# Patient Record
Sex: Female | Born: 1965 | Race: White | Hispanic: No | Marital: Married | Smoking: Never smoker
Health system: Southern US, Community
[De-identification: ages and names within clinical notes are randomized; demographics above are authoritative.]

## PROBLEM LIST (undated history)

## (undated) DIAGNOSIS — K5900 Constipation, unspecified: Secondary | ICD-10-CM

## (undated) DIAGNOSIS — N83209 Unspecified ovarian cyst, unspecified side: Secondary | ICD-10-CM

## (undated) DIAGNOSIS — T7840XA Allergy, unspecified, initial encounter: Secondary | ICD-10-CM

## (undated) DIAGNOSIS — D229 Melanocytic nevi, unspecified: Secondary | ICD-10-CM

## (undated) DIAGNOSIS — G600 Hereditary motor and sensory neuropathy: Secondary | ICD-10-CM

## (undated) DIAGNOSIS — Z789 Other specified health status: Secondary | ICD-10-CM

## (undated) HISTORY — DX: Allergy, unspecified, initial encounter: T78.40XA

## (undated) HISTORY — DX: Hereditary motor and sensory neuropathy: G60.0

## (undated) HISTORY — DX: Constipation, unspecified: K59.00

## (undated) HISTORY — DX: Unspecified ovarian cyst, unspecified side: N83.209

---
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
## 1996-05-17 HISTORY — PX: WRIST GANGLION EXCISION: SUR520

## 2002-06-15 ENCOUNTER — Ambulatory Visit (HOSPITAL_COMMUNITY): Admission: RE | Admit: 2002-06-15 | Discharge: 2002-06-15 | Payer: Self-pay | Admitting: Obstetrics and Gynecology

## 2002-06-15 ENCOUNTER — Encounter: Payer: Self-pay | Admitting: Obstetrics and Gynecology

## 2002-06-29 ENCOUNTER — Inpatient Hospital Stay (HOSPITAL_COMMUNITY): Admission: AD | Admit: 2002-06-29 | Discharge: 2002-06-29 | Payer: Self-pay | Admitting: Obstetrics and Gynecology

## 2002-07-22 ENCOUNTER — Inpatient Hospital Stay (HOSPITAL_COMMUNITY): Admission: AD | Admit: 2002-07-22 | Discharge: 2002-07-22 | Payer: Self-pay | Admitting: Obstetrics and Gynecology

## 2002-07-23 ENCOUNTER — Inpatient Hospital Stay (HOSPITAL_COMMUNITY): Admission: AD | Admit: 2002-07-23 | Discharge: 2002-07-25 | Payer: Self-pay | Admitting: Obstetrics and Gynecology

## 2003-01-30 ENCOUNTER — Other Ambulatory Visit: Admission: RE | Admit: 2003-01-30 | Discharge: 2003-01-30 | Payer: Self-pay | Admitting: Obstetrics and Gynecology

## 2003-02-13 DIAGNOSIS — N83209 Unspecified ovarian cyst, unspecified side: Secondary | ICD-10-CM

## 2003-02-13 HISTORY — DX: Unspecified ovarian cyst, unspecified side: N83.209

## 2004-08-07 ENCOUNTER — Other Ambulatory Visit: Admission: RE | Admit: 2004-08-07 | Discharge: 2004-08-07 | Payer: Self-pay | Admitting: Obstetrics and Gynecology

## 2004-08-14 ENCOUNTER — Encounter: Admission: RE | Admit: 2004-08-14 | Discharge: 2004-08-14 | Payer: Self-pay | Admitting: Obstetrics and Gynecology

## 2006-02-04 ENCOUNTER — Other Ambulatory Visit: Admission: RE | Admit: 2006-02-04 | Discharge: 2006-02-04 | Payer: Self-pay | Admitting: Obstetrics and Gynecology

## 2006-02-15 ENCOUNTER — Encounter: Admission: RE | Admit: 2006-02-15 | Discharge: 2006-02-15 | Payer: Self-pay | Admitting: Obstetrics and Gynecology

## 2006-11-11 ENCOUNTER — Encounter: Admission: RE | Admit: 2006-11-11 | Discharge: 2006-11-11 | Payer: Self-pay | Admitting: Obstetrics and Gynecology

## 2007-06-29 ENCOUNTER — Encounter: Admission: RE | Admit: 2007-06-29 | Discharge: 2007-06-29 | Payer: Self-pay | Admitting: Obstetrics and Gynecology

## 2008-09-19 ENCOUNTER — Encounter: Admission: RE | Admit: 2008-09-19 | Discharge: 2008-09-19 | Payer: Self-pay | Admitting: Obstetrics and Gynecology

## 2008-09-23 ENCOUNTER — Encounter: Admission: RE | Admit: 2008-09-23 | Discharge: 2008-09-23 | Payer: Self-pay | Admitting: Obstetrics and Gynecology

## 2009-09-09 DIAGNOSIS — R87619 Unspecified abnormal cytological findings in specimens from cervix uteri: Secondary | ICD-10-CM

## 2010-06-08 ENCOUNTER — Encounter: Payer: Self-pay | Admitting: Obstetrics and Gynecology

## 2010-07-28 ENCOUNTER — Ambulatory Visit
Admission: RE | Admit: 2010-07-28 | Discharge: 2010-07-28 | Disposition: A | Discharge status: Home / Self Care | Payer: BC Managed Care – PPO | Source: Ambulatory Visit | Attending: Family Medicine | Admitting: Family Medicine

## 2010-07-28 ENCOUNTER — Other Ambulatory Visit: Payer: Self-pay | Admitting: Family Medicine

## 2010-07-28 DIAGNOSIS — R1031 Right lower quadrant pain: Secondary | ICD-10-CM

## 2010-07-28 MED ORDER — IOHEXOL 300 MG/ML  SOLN
100.0000 mL | Freq: Once | INTRAMUSCULAR | Status: AC | PRN
  Administered 2010-07-28: 100 mL via INTRAVENOUS

## 2010-10-02 NOTE — H&P (Signed)
NAME:  Karla Castro, Karla Castro                        ACCOUNT NO.:  [id number]   MEDICAL RECORD NO.:  [id number]                   PATIENT TYPE:  INP   LOCATION:  9164                                 FACILITY:  [facility]   PHYSICIAN:  Janine Limbo, M.D.            DATE OF BIRTH:  1965/10/20   DATE OF ADMISSION:  07/23/2002  DATE OF DISCHARGE:                                HISTORY & PHYSICAL   HISTORY OF PRESENT ILLNESS:  The patient is a 45 year old married white  female, gravida 2, para 1-0-0-1, at 40-6/7 weeks, who presents complaining  of some vaginal bleeding this morning and some cramping. She reports that  she was evaluated yesterday for possible labor and was at that time 1 cm.  She subsequently has not noted very strong uterine contractions, but has  noted some bloody show.  She reports positive fetal movement. She denies  nausea, vomiting, headaches, or visual disturbances.  Her pregnancy has been  followed at [facility] by the M.D. service and has been essentially  uncomplicated, though, at risk for history of previous infant infected with  GBS, previous infant with congenital heart defect, advanced maternal age  declining amniocentesis, history of rapid labor.  She also has a family  history of some kind of muscular dystrophy.   OB GYN HISTORY:  She is a gravida 2, para 1-0-0-1, who delivered a viable  female infant in December of 2001, who weighed 7 pounds 10 ounces at [redacted] weeks  gestation following a two to three-hour labor.  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   ALLERGIES:  SULFA gives her a rash.   PAST MEDICAL HISTORY:  She reports having had the usual childhood diseases.  She has no other medical problems and her only surgery was a ganglion cyst  removed from her left hand and her only other hospitalization was for  childbirth.   FAMILY HISTORY:  Significant for paternal grandfather deceased from MI.  Maternal grandfather, maternal grandmother,  and uncle with heart disease.  Son has ASD and pulmonary valve stenosis and the problem is correcting  itself.  Maternal grandmother has varicosities.  Maternal grandfather is  deceased from tuberculosis.  Mother has hypothyroidism.  Paternal  grandfather had CVA.   GENETIC HISTORY:  Essentially negative, though, the patient is over age 34  and declined amniocentesis and there is some kind of muscular dystrophy in  her family members.   SOCIAL HISTORY:  She is married to Harriette Ohara, who is involved and  supportive. She is employed full-time as a Futures trader, he is employed full-  time as a Clinical research associate.  They deny any religious affiliation that affects her  care. They deny any illicit drug use, alcohol, or smoking with this  pregnancy.   PRENATAL LABORATORY DATA:  Her blood type is O positive, antibody screen is  negative, syphilis is nonreactive, rubella is positive.  Hepatitis B surface  antigen is negative.  Human immunodeficiency virus is negative.  GC and  Chlamydia are both negative.  Pap smear is within normal limits.  Her one-  hour Glucola was within normal range.  She is going to be treated for Beta  Strep secondary to a history of an infected child.   PHYSICAL EXAMINATION:  VITAL SIGNS:  Stable.  She is afebrile.  HEENT:  Grossly within normal limits.  HEART:  Regular rate and rhythm.  CHEST:  Clear.  BREASTS: Soft and nontender.  ABDOMEN:  Gravid with uterine contractions every five to seven minutes.  Her  fetal heart rate is reactive and reassuring.  PELVIC:  Now 3 to 4 cm, 90%, and vertex at -1 station with intact membranes  and small amount of bloody show.  EXTREMITIES:  Within normal limits.   ASSESSMENT:  1. Intrauterine pregnancy at term.  2. Early labor.  3. Positive Group B Strep with a history of infected infant.   PLAN:  Admit to labor and delivery at [facility] per Janine Limbo, M.D. and to follow routine M.D. orders.     Concha Pyo.  Duplantis, C.N.M.              Janine Limbo, M.D.    SJD/MEDQ  D:  07/23/2002  T:  07/23/2002  Job:  [id number]

## 2011-03-09 ENCOUNTER — Other Ambulatory Visit: Payer: Self-pay

## 2012-02-04 ENCOUNTER — Other Ambulatory Visit: Payer: Self-pay | Admitting: Obstetrics and Gynecology

## 2012-02-04 DIAGNOSIS — Z1231 Encounter for screening mammogram for malignant neoplasm of breast: Secondary | ICD-10-CM

## 2012-02-25 ENCOUNTER — Ambulatory Visit
Admission: RE | Admit: 2012-02-25 | Discharge: 2012-02-25 | Disposition: A | Discharge status: Home / Self Care | Payer: BC Managed Care – PPO | Source: Ambulatory Visit | Attending: Obstetrics and Gynecology | Admitting: Obstetrics and Gynecology

## 2012-02-25 DIAGNOSIS — Z1231 Encounter for screening mammogram for malignant neoplasm of breast: Secondary | ICD-10-CM

## 2012-02-28 ENCOUNTER — Encounter: Payer: Self-pay | Admitting: Obstetrics and Gynecology

## 2012-02-28 ENCOUNTER — Other Ambulatory Visit (HOSPITAL_COMMUNITY)
Admission: RE | Admit: 2012-02-28 | Discharge: 2012-02-28 | Disposition: A | Discharge status: Home / Self Care | Payer: BC Managed Care – PPO | Source: Ambulatory Visit | Attending: Family Medicine | Admitting: Family Medicine

## 2012-02-28 ENCOUNTER — Other Ambulatory Visit: Payer: Self-pay | Admitting: Physician Assistant

## 2012-02-28 DIAGNOSIS — Z0000 Encounter for general adult medical examination without abnormal findings: Secondary | ICD-10-CM | POA: Insufficient documentation

## 2013-01-24 ENCOUNTER — Ambulatory Visit: Payer: BC Managed Care – PPO

## 2013-01-24 ENCOUNTER — Ambulatory Visit: Payer: BC Managed Care – PPO | Admitting: Emergency Medicine

## 2013-01-24 VITALS — BP 116/68 | HR 58 | Temp 98.6°F | Resp 16 | Ht 66.0 in | Wt 138.0 lb

## 2013-01-24 DIAGNOSIS — S7000XA Contusion of unspecified hip, initial encounter: Secondary | ICD-10-CM

## 2013-01-24 DIAGNOSIS — S7001XA Contusion of right hip, initial encounter: Secondary | ICD-10-CM

## 2013-01-24 DIAGNOSIS — S93609A Unspecified sprain of unspecified foot, initial encounter: Secondary | ICD-10-CM

## 2013-01-24 DIAGNOSIS — S93602A Unspecified sprain of left foot, initial encounter: Secondary | ICD-10-CM

## 2013-01-24 DIAGNOSIS — M79672 Pain in left foot: Secondary | ICD-10-CM

## 2013-01-24 NOTE — Patient Instructions (Addendum)
Contusion A contusion is a deep bruise. Contusions are the result of an injury that caused bleeding under the skin. The contusion may turn blue, purple, or yellow. Minor injuries will give you a painless contusion, but more severe contusions may stay painful and swollen for a few weeks.  CAUSES  A contusion is usually caused by a blow, trauma, or direct force to an area of the body. SYMPTOMS   Swelling and redness of the injured area.  Bruising of the injured area.  Tenderness and soreness of the injured area.  Pain. DIAGNOSIS  The diagnosis can be made by taking a history and physical exam. An X-ray, CT scan, or MRI may be needed to determine if there were any associated injuries, such as fractures. TREATMENT  Specific treatment will depend on what area of the body was injured. In general, the best treatment for a contusion is resting, icing, elevating, and applying cold compresses to the injured area. Over-the-counter medicines may also be recommended for pain control. Ask your caregiver what the best treatment is for your contusion. HOME CARE INSTRUCTIONS   Put ice on the injured area.  Put ice in a plastic bag.  Place a towel between your skin and the bag.  Leave the ice on for 15-20 minutes, 3-4 times a day.  Only take over-the-counter or prescription medicines for pain, discomfort, or fever as directed by your caregiver. Your caregiver may recommend avoiding anti-inflammatory medicines (aspirin, ibuprofen, and naproxen) for 48 hours because these medicines may increase bruising.  Rest the injured area.  If possible, elevate the injured area to reduce swelling. SEEK IMMEDIATE MEDICAL CARE IF:   You have increased bruising or swelling.  You have pain that is getting worse.  Your swelling or pain is not relieved with medicines. MAKE SURE YOU:   Understand these instructions.  Will watch your condition.  Will get help right away if you are not doing well or get  worse. Document Released: 02/10/2005 Document Revised: 07/26/2011 Document Reviewed: 03/08/2011 Exit[employer] Patient Information 2014 [employer].  

## 2013-01-24 NOTE — Progress Notes (Signed)
[facility] [address] [place] [address] [phone]  Date:  01/24/2013   Name:  Karla Castro   DOB:  04-Apr-1966   MRN:  [id number]  PCP:  Pcp Not In System    Chief Complaint: Foot Injury and Hip Injury   History of Present Illness:  Karla Castro is a 47 y.o. very pleasant female patient who presents with the following:  Tripped while vacuuming the stairway on Monday morning.  Landed on her right hip and has a significant bruise but injured her LEFT foot and has pain across her midfoot that is worse with walking or standing.  Denies pain in the ankle.  No improvement with over the counter medications or other home remedies. Denies other complaint or health concern today.   There are no active problems to display for this patient.   Past Medical History  Diagnosis Date  . Allergy     Past Surgical History  Procedure Laterality Date  . Wrist ganglion excision  1998    History  Substance Use Topics  . Smoking status: Never Smoker   . Smokeless tobacco: Not on file  . Alcohol Use: Not on file    Family History  Problem Relation Age of Onset  . Arthritis Mother   . Charcot-Marie-Tooth disease Mother   . Osteoporosis Mother   . Hypothyroidism Mother   . Hypertension Father   . Heart attack Maternal Grandmother   . Emphysema Maternal Grandfather   . Vision loss Maternal Grandfather   . Kidney failure Paternal Grandmother   . Hypertension Paternal Grandmother   . Osteoporosis Paternal Grandmother   . Arthritis Paternal Grandmother   . Heart attack Paternal Grandfather     Allergies  Allergen Reactions  . Sulfa Antibiotics Itching and Rash    Medication list has been reviewed and updated.  No current outpatient prescriptions on file prior to visit.   No current facility-administered medications on file prior to visit.    Review of Systems:  As per HPI, otherwise negative.    Physical Examination: Filed Vitals:   01/24/13 1130  BP: 116/68  Pulse: 58  Temp: 98.6 F (37 C)  Resp: 16   Filed Vitals:   01/24/13 1130  Height: 5\' 6"  (1.676 m)  Weight: 138 lb (62.596 kg)   Body mass index is 22.28 kg/(m^2). Ideal Body Weight: Weight in (lb) to have BMI = 25: 154.6   GEN: WDWN, NAD, Non-toxic, Alert & Oriented x 3 HEENT: Atraumatic, Normocephalic.  Ears and Nose: No external deformity. EXTR: No clubbing/cyanosis/edema NEURO: antalgic gait.  PSYCH: Normally interactive. Conversant. Not depressed or anxious appearing.  Calm demeanor.  Large bruise proximal RIGHT hip.  Tender and guards.  LEFT foot:  Tender midfoot.  No ecchymosis or deformity.  Assessment and Plan: Contusion hip Sprain foot OTC motrin Elevate, ice, rest Boot   Signed,  Phillips Odor, MD   [facility] reading (PRIMARY) by  Dr. Dareen Piano.  Foot negative.  [facility] reading (PRIMARY) by  Dr. Dareen Piano.  Hip negative.

## 2013-06-05 ENCOUNTER — Other Ambulatory Visit: Payer: Self-pay

## 2013-06-05 DIAGNOSIS — Z1231 Encounter for screening mammogram for malignant neoplasm of breast: Secondary | ICD-10-CM

## 2013-06-25 ENCOUNTER — Ambulatory Visit
Admission: RE | Admit: 2013-06-25 | Discharge: 2013-06-25 | Disposition: A | Discharge status: Home / Self Care | Payer: BC Managed Care – PPO | Source: Ambulatory Visit

## 2013-06-25 ENCOUNTER — Other Ambulatory Visit: Payer: Self-pay

## 2013-06-25 DIAGNOSIS — Z1231 Encounter for screening mammogram for malignant neoplasm of breast: Secondary | ICD-10-CM

## 2013-08-31 DIAGNOSIS — Z975 Presence of (intrauterine) contraceptive device: Secondary | ICD-10-CM | POA: Insufficient documentation

## 2014-01-15 ENCOUNTER — Ambulatory Visit (INDEPENDENT_AMBULATORY_CARE_PROVIDER_SITE_OTHER): Payer: BC Managed Care – PPO | Admitting: Emergency Medicine

## 2014-01-15 VITALS — BP 120/70 | HR 61 | Temp 98.2°F | Resp 16 | Ht 66.0 in | Wt 140.4 lb

## 2014-01-15 DIAGNOSIS — IMO0002 Reserved for concepts with insufficient information to code with codable children: Secondary | ICD-10-CM

## 2014-01-15 DIAGNOSIS — S50312A Abrasion of left elbow, initial encounter: Secondary | ICD-10-CM

## 2014-01-15 DIAGNOSIS — L02818 Cutaneous abscess of other sites: Secondary | ICD-10-CM

## 2014-01-15 DIAGNOSIS — L03818 Cellulitis of other sites: Secondary | ICD-10-CM

## 2014-01-15 DIAGNOSIS — Z23 Encounter for immunization: Secondary | ICD-10-CM

## 2014-01-15 DIAGNOSIS — S90512A Abrasion, left ankle, initial encounter: Secondary | ICD-10-CM

## 2014-01-15 MED ORDER — DOXYCYCLINE HYCLATE 100 MG PO CAPS: 100.0000 mg | ORAL_CAPSULE | Freq: Two times a day (BID) | ORAL | Status: DC

## 2014-01-15 NOTE — Patient Instructions (Signed)

## 2014-01-15 NOTE — Progress Notes (Signed)
[facility] [address] [place] [address] [phone]  Date:  01/15/2014   Name:  Karla Castro   DOB:  [birth year]   MRN:  [id number]  PCP:  Pcp Not In System    Chief Complaint: Elbow Injury   History of Present Illness:  Karla Castro is a 48 y.o. very pleasant female patient who presents with the following:  Injured this weekend while white water rafting.  Has abrasions on the left elbow and ankle that are increasingly more red and tender. No fever or chills.  Denies any joint injury No improvement with over the counter medications or other home remedies. Denies other complaint or health concern today.   There are no active problems to display for this patient.   Past Medical History  Diagnosis Date  . Allergy     Past Surgical History  Procedure Laterality Date  . Wrist ganglion excision  1998    History  Substance Use Topics  . Smoking status: Never Smoker   . Smokeless tobacco: Never Used  . Alcohol Use: No    Family History  Problem Relation Age of Onset  . Arthritis Mother   . Charcot-Marie-Tooth disease Mother   . Osteoporosis Mother   . Hypothyroidism Mother   . Hypertension Father   . Heart attack Maternal Grandmother   . Emphysema Maternal Grandfather   . Vision loss Maternal Grandfather   . Kidney failure Paternal Grandmother   . Hypertension Paternal Grandmother   . Osteoporosis Paternal Grandmother   . Arthritis Paternal Grandmother   . Heart attack Paternal Grandfather     Allergies  Allergen Reactions  . Sulfa Antibiotics Itching and Rash    Medication list has been reviewed and updated.  No current outpatient prescriptions on file prior to visit.   No current facility-administered medications on file prior to visit.    Review of Systems:  As per HPI, otherwise negative.    Physical Examination: Filed Vitals:   01/15/14 1804  BP: 120/70  Pulse: 61  Temp: 98.2 F (36.8 C)  Resp: 16    Filed Vitals:   01/15/14 1804  Height: 5\' 6"  (1.676 m)  Weight: 140 lb 6 oz (63.674 kg)   Body mass index is 22.67 kg/(m^2). Ideal Body Weight: Weight in (lb) to have BMI = 25: 154.6   GEN: WDWN, NAD, Non-toxic, Alert & Oriented x 3 HEENT: Atraumatic, Normocephalic.  Ears and Nose: No external deformity. EXTR: No clubbing/cyanosis/edema NEURO: Normal gait.  PSYCH: Normally interactive. Conversant. Not depressed or anxious appearing.  Calm demeanor.  SKIN:  Abrasion left elbow and lateral malleolus.  Joint stable. Mild cellulitis   Assessment and Plan: Cellulitis  Abrasions Doxy TDAP  Signed,  Ellison Carwin, MD

## 2015-02-25 ENCOUNTER — Encounter: Payer: Self-pay | Admitting: Family Medicine

## 2015-02-25 ENCOUNTER — Ambulatory Visit (INDEPENDENT_AMBULATORY_CARE_PROVIDER_SITE_OTHER): Payer: BLUE CROSS/BLUE SHIELD | Admitting: Family Medicine

## 2015-02-25 VITALS — BP 95/63 | HR 76 | Temp 98.7°F | Resp 16 | Wt 139.6 lb

## 2015-02-25 DIAGNOSIS — Z23 Encounter for immunization: Secondary | ICD-10-CM

## 2015-02-25 DIAGNOSIS — R1012 Left upper quadrant pain: Secondary | ICD-10-CM | POA: Diagnosis not present

## 2015-02-25 DIAGNOSIS — M25562 Pain in left knee: Secondary | ICD-10-CM

## 2015-02-25 LAB — COMPREHENSIVE METABOLIC PANEL
ALBUMIN: 4.6 g/dL (ref 3.6–5.1)
ALK PHOS: 81 U/L (ref 33–115)
ALT: 13 U/L (ref 6–29)
AST: 15 U/L (ref 10–35)
BUN: 13 mg/dL (ref 7–25)
CALCIUM: 9.5 mg/dL (ref 8.6–10.2)
CHLORIDE: 100 mmol/L (ref 98–110)
CO2: 31 mmol/L (ref 20–31)
Creat: 0.66 mg/dL (ref 0.50–1.10)
Glucose, Bld: 73 mg/dL (ref 65–99)
POTASSIUM: 3.7 mmol/L (ref 3.5–5.3)
Sodium: 136 mmol/L (ref 135–146)
TOTAL PROTEIN: 7.2 g/dL (ref 6.1–8.1)
Total Bilirubin: 0.3 mg/dL (ref 0.2–1.2)

## 2015-02-25 LAB — CBC
HEMATOCRIT: 35.5 % — AB (ref 36.0–46.0)
HEMOGLOBIN: 12.1 g/dL (ref 12.0–15.0)
MCH: 28.7 pg (ref 26.0–34.0)
MCHC: 34.1 g/dL (ref 30.0–36.0)
MCV: 84.1 fL (ref 78.0–100.0)
MPV: 11.5 fL (ref 8.6–12.4)
Platelets: 258 10*3/uL (ref 150–400)
RBC: 4.22 MIL/uL (ref 3.87–5.11)
RDW: 13.5 % (ref 11.5–15.5)
WBC: 5 10*3/uL (ref 4.0–10.5)

## 2015-02-25 LAB — LIPASE: LIPASE: 25 U/L (ref 7–60)

## 2015-02-25 LAB — AMYLASE: AMYLASE: 70 U/L (ref 0–105)

## 2015-02-25 MED ORDER — OMEPRAZOLE 20 MG PO CPDR: 20.0000 mg | DELAYED_RELEASE_CAPSULE | Freq: Every day | ORAL | Status: DC

## 2015-02-25 NOTE — Patient Instructions (Signed)
Start a probiotic daily

## 2015-02-25 NOTE — Progress Notes (Signed)
Subjective:    Patient ID: Karla Castro, female    DOB: 03-12-1966, 49 y.o.   MRN: [id number]  HPI This is a pleasant 49 yo female who presents with 4-6 week history of postprandial LUQ "discomfort." Over the last 3 weeks, she notices more of a "burn," this is worse at night when she lies down and worse with acidic foods and beverages. Pain 6/10 at it's worse. Pain is constant. Drank some kefir yesterday which gave her some relief. Has not taken any OTC medication. Feels like her gut has "slowed down" and has BM every other day which is less frequent for her. Her symptoms were preceded by a gastro illness.  Left knee pain with getting up from standing/ sleeping for about 1 year. Pain goes away with getting up and walking. Does not bother her to garden or walk. Takes ibuprofen 1-2 x per week with good relief. Did have a fall a couple of years ago and hurt left ankle and right shoulder. Has noticed more discomfort with flat shoes. Relief with massage.   Goes to gyn annually. Was seen 8/16- had normal pap and mammo.   Past Medical History  Diagnosis Date  . Allergy    Past Surgical History  Procedure Laterality Date  . Wrist ganglion excision  1998   Family History  Problem Relation Age of Onset  . Arthritis Mother   . Charcot-Marie-Tooth disease Mother   . Osteoporosis Mother   . Hypothyroidism Mother   . Hypertension Father   . Heart attack Maternal Grandmother   . Emphysema Maternal Grandfather   . Vision loss Maternal Grandfather   . Kidney failure Paternal Grandmother   . Hypertension Paternal Grandmother   . Osteoporosis Paternal Grandmother   . Arthritis Paternal Grandmother   . Heart attack Paternal Grandfather    Social History  Substance Use Topics  . Smoking status: Never Smoker   . Smokeless tobacco: Never Used  . Alcohol Use: No   Review of Systems No blood or mucous in stool, no dark stools. Feels bloated and full. No nausea or vomiting. No weight loss. No  urinary symptoms, no vaginal discharge.      Objective:   Physical Exam  Constitutional: She appears well-developed and well-nourished.  HENT:  Head: Normocephalic and atraumatic.  Eyes: Conjunctivae are normal.  Cardiovascular: Normal rate, regular rhythm and normal heart sounds.   Pulmonary/Chest: Effort normal and breath sounds normal.  Abdominal: Soft. Bowel sounds are normal. She exhibits no distension and no mass. Tenderness: mild tenderness LUQ to deep palpation. There is no rebound and no guarding.  Musculoskeletal: Normal range of motion.       Left knee: She exhibits normal range of motion, no swelling, no effusion, no deformity, normal alignment, no LCL laxity and normal patellar mobility. No tenderness found.  Vitals reviewed.  BP 95/63 mmHg  Pulse 76  Temp(Src) 98.7 F (37.1 C) (Oral)  Resp 16  Wt 139 lb 9.6 oz (63.322 kg) Wt Readings from Last 3 Encounters:  02/25/15 139 lb 9.6 oz (63.322 kg)  01/15/14 140 lb 6 oz (63.674 kg)  01/24/13 138 lb (62.596 kg)      Assessment & Plan:  1. Need for prophylactic vaccination and inoculation against influenza - Flu Vaccine QUAD 36+ mos IM  2. Left upper quadrant pain - CBC - Comprehensive metabolic panel - Amylase - Lipase - omeprazole (PRILOSEC) 20 MG capsule; Take 1 capsule (20 mg total) by mouth daily.  Dispense: 30  capsule; Refill: 2 - Recommended OTC probiotic daily - follow up in 1 month, sooner if worsening symptoms  3. Left knee pain - exam unremarkable, discussed quadricept strengthening, wearing supportive shoes, using heat and OTC slip on brace - if no improvement with above measures, can refer to ortho  Clarene Reamer, FNP-BC  [facility] [place] [facility]  02/25/2015 2:32 PM

## 2015-03-24 ENCOUNTER — Ambulatory Visit: Payer: BLUE CROSS/BLUE SHIELD | Admitting: Family Medicine

## 2015-07-29 ENCOUNTER — Encounter: Payer: Self-pay | Admitting: Family Medicine

## 2015-07-29 ENCOUNTER — Ambulatory Visit (INDEPENDENT_AMBULATORY_CARE_PROVIDER_SITE_OTHER): Payer: BLUE CROSS/BLUE SHIELD | Admitting: Family Medicine

## 2015-07-29 VITALS — BP 100/64 | HR 75 | Temp 98.7°F | Resp 16 | Ht 66.0 in | Wt 141.6 lb

## 2015-07-29 DIAGNOSIS — M25512 Pain in left shoulder: Secondary | ICD-10-CM | POA: Diagnosis not present

## 2015-07-29 MED ORDER — MELOXICAM 15 MG PO TABS
15.0000 mg | ORAL_TABLET | Freq: Every day | ORAL | Status: DC
Start: 2015-07-29 — End: 2015-12-27

## 2015-07-29 NOTE — Progress Notes (Signed)
Subjective:    Patient ID: Karla Castro, female    DOB: Nov 20, 1965, 50 y.o.   MRN: [id number]  HPI This is a pleasant 50 year old female that presents with left shoulder pain since last summer. Shoulder is painful with activity. In February pt injured her shoulder after getting her sweater caught on something. Limited range of motion. Pain is not radiating. Pt has tried Ibuprofen, elevating the shoulder, and icing the shoulder with no relief.   Patient has tried some stretches with no relief.   Past Medical History  Diagnosis Date  . Allergy    Family History  Problem Relation Age of Onset  . Arthritis Mother   . Charcot-Marie-Tooth disease Mother   . Osteoporosis Mother   . Hypothyroidism Mother   . Hypertension Father   . Heart attack Maternal Grandmother   . Emphysema Maternal Grandfather   . Vision loss Maternal Grandfather   . Kidney failure Paternal Grandmother   . Hypertension Paternal Grandmother   . Osteoporosis Paternal Grandmother   . Arthritis Paternal Grandmother   . Heart attack Paternal Grandfather    Social History   Social History  . Marital Status: Married    Spouse Name: N/A  . Number of Children: N/A  . Years of Education: N/A   Occupational History  . Not on file.   Social History Main Topics  . Smoking status: Never Smoker   . Smokeless tobacco: Never Used  . Alcohol Use: No  . Drug Use: No  . Sexual Activity: Not on file   Other Topics Concern  . Not on file   Social History Narrative    Review of Systems  Constitutional: Positive for activity change (unable to lift left arm like  before).  HENT: Negative for congestion, mouth sores, rhinorrhea, sinus pressure, sneezing and sore throat.   Eyes: Positive for itching (allergies, son has pink eye also).  Respiratory: Negative for chest tightness, shortness of breath and wheezing.   Cardiovascular: Negative for chest pain.  Gastrointestinal: Negative for nausea, vomiting and  diarrhea.  Musculoskeletal: Positive for myalgias (around left shoulder joint) and arthralgias (left shoulder). Negative for back pain and joint swelling.  Neurological: Negative for dizziness and headaches.       Objective:   Physical Exam  Constitutional: She is oriented to person, place, and time. She appears well-developed and well-nourished.  HENT:  Head: Normocephalic and atraumatic.  Eyes: Conjunctivae are normal. Pupils are equal, round, and reactive to light.  Neck: Normal range of motion.  Cardiovascular: Normal rate, regular rhythm and normal heart sounds.   Pulmonary/Chest: Effort normal and breath sounds normal. No respiratory distress. She has no wheezes.  Musculoskeletal: She exhibits tenderness (left shoulder).  Neurological: She is alert and oriented to person, place, and time.  Skin: Skin is warm and dry.  Psychiatric: She has a normal mood and affect. Her behavior is normal. Judgment and thought content normal.       BP 100/64 mmHg  Pulse 75  Temp(Src) 98.7 F (37.1 C) (Oral)  Resp 16  Ht 5\' 6"  (1.676 m)  Wt 141 lb 9.6 oz (64.229 kg)  BMI 22.87 kg/m2  SpO2 98%  Wt Readings from Last 3 Encounters:  07/29/15 141 lb 9.6 oz (64.229 kg)  02/25/15 139 lb 9.6 oz (63.322 kg)  01/15/14 140 lb 6 oz (63.674 kg)   Temp Readings from Last 3 Encounters:  07/29/15 98.7 F (37.1 C) Oral  02/25/15 98.7 F (37.1 C) Oral  01/15/14 98.2 F (36.8 C) Oral   BP Readings from Last 3 Encounters:  07/29/15 100/64  02/25/15 95/63  01/15/14 120/70   Pulse Readings from Last 3 Encounters:  07/29/15 75  02/25/15 76  01/15/14 61        Assessment & Plan:  1. Left shoulder pain - meloxicam (MOBIC) 15 MG tablet; Take 1 tablet (15 mg total) by mouth daily.  Dispense: 30 tablet; Refill: 1 - Ambulatory referral to Orthopedic Surgery   Steffanie Dunn, FNP-student  [facility] [place] [facility]  07/29/2015 4:03 PM

## 2015-07-29 NOTE — Progress Notes (Signed)
   Subjective:    Patient ID: Karla Castro, female    DOB: 01-29-1966, 50 y.o.   MRN: [id number]  HPI This is a pleasant 50 yo female who presents today with left shoulder pain. 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 She re- injured it while shopping last month; her sweater got caught on a display and pulled her arm back. She has tired ibuprofen, ice/heat. Has had decreased ROM, no numbness, tingling, or weakness. No pain at rest, pain with activity.   Past Medical History  Diagnosis Date  . Allergy    Past Surgical History  Procedure Laterality Date  . Wrist ganglion excision  1998   Family History  Problem Relation Age of Onset  . Arthritis Mother   . Charcot-Marie-Tooth disease Mother   . Osteoporosis Mother   . Hypothyroidism Mother   . Hypertension Father   . Heart attack Maternal Grandmother   . Emphysema Maternal Grandfather   . Vision loss Maternal Grandfather   . Kidney failure Paternal Grandmother   . Hypertension Paternal Grandmother   . Osteoporosis Paternal Grandmother   . Arthritis Paternal Grandmother   . Heart attack Paternal Grandfather    Social History  Substance Use Topics  . Smoking status: Never Smoker   . Smokeless tobacco: Never Used  . Alcohol Use: No      Review of Systems Per HPI    Objective:   Physical Exam  Constitutional: She is oriented to person, place, and time. She appears well-developed and well-nourished. No distress.  HENT:  Head: Normocephalic and atraumatic.  Eyes: Conjunctivae are normal.  Neck: Normal range of motion. Neck supple.  Cardiovascular: Normal rate.   Pulmonary/Chest: Effort normal.  Musculoskeletal:       Left shoulder: She exhibits decreased range of motion, tenderness and pain. She exhibits no bony tenderness, no swelling, no effusion and normal strength.  Pain and decreased ROM with abduction, external rotation.  Neurological: She is alert and oriented to  person, place, and time.  Skin: Skin is warm and dry. She is not diaphoretic.  Psychiatric: She has a normal mood and affect. Her behavior is normal. Judgment and thought content normal.  Vitals reviewed.  BP 100/64 mmHg  Pulse 75  Temp(Src) 98.7 F (37.1 C) (Oral)  Resp 16  Ht 5\' 6"  (1.676 m)  Wt 141 lb 9.6 oz (64.229 kg)  BMI 22.87 kg/m2  SpO2 98%     Assessment & Plan:  1. Left shoulder pain - meloxicam (MOBIC) 15 MG tablet; Take 1 tablet (15 mg total) by mouth daily.  Dispense: 30 tablet; Refill: 1 - Ambulatory referral to [place], FNP-BC  [facility] [place] [facility]  08/02/2015 9:25 AM

## 2015-09-13 DIAGNOSIS — M67912 Unspecified disorder of synovium and tendon, left shoulder: Secondary | ICD-10-CM | POA: Diagnosis not present

## 2015-09-30 DIAGNOSIS — M25512 Pain in left shoulder: Secondary | ICD-10-CM | POA: Diagnosis not present

## 2015-09-30 DIAGNOSIS — M25612 Stiffness of left shoulder, not elsewhere classified: Secondary | ICD-10-CM | POA: Diagnosis not present

## 2015-10-04 DIAGNOSIS — M25512 Pain in left shoulder: Secondary | ICD-10-CM | POA: Diagnosis not present

## 2015-10-04 DIAGNOSIS — M25612 Stiffness of left shoulder, not elsewhere classified: Secondary | ICD-10-CM | POA: Diagnosis not present

## 2015-10-09 DIAGNOSIS — M25512 Pain in left shoulder: Secondary | ICD-10-CM | POA: Diagnosis not present

## 2015-10-09 DIAGNOSIS — M25612 Stiffness of left shoulder, not elsewhere classified: Secondary | ICD-10-CM | POA: Diagnosis not present

## 2015-10-14 DIAGNOSIS — M25612 Stiffness of left shoulder, not elsewhere classified: Secondary | ICD-10-CM | POA: Diagnosis not present

## 2015-10-14 DIAGNOSIS — M25512 Pain in left shoulder: Secondary | ICD-10-CM | POA: Diagnosis not present

## 2015-10-23 DIAGNOSIS — M25512 Pain in left shoulder: Secondary | ICD-10-CM | POA: Diagnosis not present

## 2015-10-23 DIAGNOSIS — M25612 Stiffness of left shoulder, not elsewhere classified: Secondary | ICD-10-CM | POA: Diagnosis not present

## 2015-10-27 DIAGNOSIS — M25612 Stiffness of left shoulder, not elsewhere classified: Secondary | ICD-10-CM | POA: Diagnosis not present

## 2015-10-27 DIAGNOSIS — M25512 Pain in left shoulder: Secondary | ICD-10-CM | POA: Diagnosis not present

## 2015-10-30 DIAGNOSIS — M25612 Stiffness of left shoulder, not elsewhere classified: Secondary | ICD-10-CM | POA: Diagnosis not present

## 2015-10-30 DIAGNOSIS — M25512 Pain in left shoulder: Secondary | ICD-10-CM | POA: Diagnosis not present

## 2015-11-05 DIAGNOSIS — M25512 Pain in left shoulder: Secondary | ICD-10-CM | POA: Diagnosis not present

## 2015-11-05 DIAGNOSIS — H5203 Hypermetropia, bilateral: Secondary | ICD-10-CM | POA: Diagnosis not present

## 2015-11-05 DIAGNOSIS — H524 Presbyopia: Secondary | ICD-10-CM | POA: Diagnosis not present

## 2015-11-05 DIAGNOSIS — H52221 Regular astigmatism, right eye: Secondary | ICD-10-CM | POA: Diagnosis not present

## 2015-11-05 DIAGNOSIS — M25612 Stiffness of left shoulder, not elsewhere classified: Secondary | ICD-10-CM | POA: Diagnosis not present

## 2015-11-06 DIAGNOSIS — M25512 Pain in left shoulder: Secondary | ICD-10-CM | POA: Diagnosis not present

## 2015-11-06 DIAGNOSIS — M25612 Stiffness of left shoulder, not elsewhere classified: Secondary | ICD-10-CM | POA: Diagnosis not present

## 2015-11-11 DIAGNOSIS — M25612 Stiffness of left shoulder, not elsewhere classified: Secondary | ICD-10-CM | POA: Diagnosis not present

## 2015-11-11 DIAGNOSIS — M25512 Pain in left shoulder: Secondary | ICD-10-CM | POA: Diagnosis not present

## 2015-11-13 DIAGNOSIS — M25512 Pain in left shoulder: Secondary | ICD-10-CM | POA: Diagnosis not present

## 2015-11-13 DIAGNOSIS — M25612 Stiffness of left shoulder, not elsewhere classified: Secondary | ICD-10-CM | POA: Diagnosis not present

## 2015-11-24 DIAGNOSIS — M25512 Pain in left shoulder: Secondary | ICD-10-CM | POA: Diagnosis not present

## 2015-11-24 DIAGNOSIS — M25612 Stiffness of left shoulder, not elsewhere classified: Secondary | ICD-10-CM | POA: Diagnosis not present

## 2015-12-27 ENCOUNTER — Encounter: Payer: Self-pay | Admitting: Physician Assistant

## 2015-12-27 ENCOUNTER — Ambulatory Visit (INDEPENDENT_AMBULATORY_CARE_PROVIDER_SITE_OTHER): Payer: BLUE CROSS/BLUE SHIELD | Admitting: Physician Assistant

## 2015-12-27 VITALS — BP 140/68 | HR 76 | Temp 98.1°F | Resp 20 | Ht 66.0 in | Wt 144.4 lb

## 2015-12-27 DIAGNOSIS — R05 Cough: Secondary | ICD-10-CM

## 2015-12-27 DIAGNOSIS — R059 Cough, unspecified: Secondary | ICD-10-CM

## 2015-12-27 MED ORDER — AZITHROMYCIN 250 MG PO TABS: ORAL_TABLET | ORAL | 0 refills | Status: DC

## 2015-12-27 MED ORDER — BENZONATATE 100 MG PO CAPS: 100.0000 mg | ORAL_CAPSULE | Freq: Three times a day (TID) | ORAL | 0 refills | Status: DC | PRN

## 2015-12-27 NOTE — Progress Notes (Signed)
Karla Castro  MRN: [id number] DOB: [birth year]  Subjective:  Karla Castro is a 50 y.o. female seen in office today for a chief complaint of cold x 1 week. Has associated sore throat, cough, body aches, and chills. Denies fever, dizziness, and decreased appetite.  Pt notes she is getting progressively worse. Was initially producing clear sputum during coughing episodes but for the past few days she has thick yellow sputum production.  Of note, patient did help extended family clean out a dusty cabin two weeks ago. One of the family members present had a cough and was recently treated for "walking pneumonia."   Has tried Mucinex, tylenol, and ibuprofen consistently around the clock for the past six days with minimal relief.  Review of Systems  Constitutional: Negative for appetite change.  HENT: Positive for ear pain. Negative for congestion, sinus pressure and sneezing.   Respiratory: Positive for shortness of breath ( during coughing episodes).   Gastrointestinal: Negative for diarrhea, nausea and vomiting.  Allergic/Immunologic: Positive for environmental allergies ( typically in Spring).    There are no active problems to display for this patient.   No current outpatient prescriptions on file prior to visit.   No current facility-administered medications on file prior to visit.     Allergies  Allergen Reactions  . Sulfa Antibiotics Itching and Rash    Objective:  BP 140/68 (BP Location: Right Arm, Patient Position: Sitting, Cuff Size: Normal)   Pulse 76   Temp 98.1 F (36.7 C) (Oral)   Resp 20   Ht 5\' 6"  (1.676 m)   Wt 144 lb 6.4 oz (65.5 kg)   SpO2 98%   BMI 23.31 kg/m   Physical Exam  Constitutional: She is oriented to person, place, and time and well-developed, well-nourished, and in no distress.  HENT:  Head: Normocephalic and atraumatic.  Right Ear: Tympanic membrane, external ear and ear canal normal.  Left Ear: Tympanic membrane, external ear and ear  canal normal.  Nose: Nose normal. Right sinus exhibits no maxillary sinus tenderness and no frontal sinus tenderness. Left sinus exhibits no maxillary sinus tenderness and no frontal sinus tenderness.  Mouth/Throat: Posterior oropharyngeal erythema present.  Eyes: Conjunctivae are normal.  Neck: Normal range of motion.  Cardiovascular: Normal rate, regular rhythm and normal heart sounds.   Pulmonary/Chest: Effort normal. She has no wheezes. She has no rhonchi. She has no rales.  Course breath sounds heard in bilateral anterior and posterior lung fields, cleared with cough.   Lymphadenopathy:       Head (right side): No submental, no submandibular, no tonsillar, no preauricular, no posterior auricular and no occipital adenopathy present.       Head (left side): No submental, no submandibular, no tonsillar, no preauricular, no posterior auricular and no occipital adenopathy present.    She has cervical adenopathy.       Right cervical: Posterior cervical adenopathy present.       Left cervical: Posterior cervical adenopathy present.       Right: No supraclavicular adenopathy present.       Left: No supraclavicular adenopathy present.  Neurological: She is alert and oriented to person, place, and time. Gait normal.  Skin: Skin is warm and dry.  Psychiatric: Affect normal.  Vitals reviewed.   Assessment and Plan :   1. Cough -Due to progressive worsening of cough, change in color of sputum production, and consistent used of OTC supportive meds with no relief, will treat with antibiotics  -  benzonatate (TESSALON) 100 MG capsule; Take 1-2 capsules (100-200 mg total) by mouth 3 (three) times daily as needed for cough.  Dispense: 40 capsule; Refill: 0 - azithromycin (ZITHROMAX) 250 MG tablet; Take 2 tabs PO x 1 dose, then 1 tab PO QD x 4 days  Dispense: 6 tablet; Refill: 0 -Encourage rest and fluids -If no improvement after antibiotic is completed, return to clinic for possible imaging at this  time   Tenna Delaine PA-C  [facility] 12/27/2015 2:39 PM

## 2015-12-27 NOTE — Patient Instructions (Addendum)
Recommend rest and drink plenty of fluids Take antibiotic as prescribed Tessalon perles as needed for cough up to three times a day Return if symptoms do not improve or worsen after antibiotics    IF you received an x-ray today, you will receive an invoice from [facility]. Please contact [facility] at [phone] with questions or concerns regarding your invoice.   IF you received labwork today, you will receive an invoice from [employer]. Please contact [facility] at [phone] with questions or concerns regarding your invoice.   Our billing staff will not be able to assist you with questions regarding bills from these companies.  You will be contacted with the lab results as soon as they are available. The fastest way to get your results is to activate your My Chart account. Instructions are located on the last page of this paperwork. If you have not heard from [place] regarding the results in 2 weeks, please contact this office.

## 2015-12-29 DIAGNOSIS — M25612 Stiffness of left shoulder, not elsewhere classified: Secondary | ICD-10-CM | POA: Diagnosis not present

## 2015-12-29 DIAGNOSIS — M25512 Pain in left shoulder: Secondary | ICD-10-CM | POA: Diagnosis not present

## 2016-01-07 ENCOUNTER — Ambulatory Visit (INDEPENDENT_AMBULATORY_CARE_PROVIDER_SITE_OTHER): Payer: BLUE CROSS/BLUE SHIELD

## 2016-01-07 ENCOUNTER — Ambulatory Visit (INDEPENDENT_AMBULATORY_CARE_PROVIDER_SITE_OTHER): Payer: BLUE CROSS/BLUE SHIELD | Admitting: Physician Assistant

## 2016-01-07 VITALS — BP 110/68 | HR 74 | Temp 98.2°F | Resp 16 | Ht 66.0 in | Wt 142.6 lb

## 2016-01-07 DIAGNOSIS — R300 Dysuria: Secondary | ICD-10-CM | POA: Diagnosis not present

## 2016-01-07 DIAGNOSIS — R059 Cough, unspecified: Secondary | ICD-10-CM

## 2016-01-07 DIAGNOSIS — R05 Cough: Secondary | ICD-10-CM

## 2016-01-07 DIAGNOSIS — J9801 Acute bronchospasm: Secondary | ICD-10-CM | POA: Diagnosis not present

## 2016-01-07 LAB — POCT URINALYSIS DIP (MANUAL ENTRY)
BILIRUBIN UA: NEGATIVE
Blood, UA: NEGATIVE
GLUCOSE UA: NEGATIVE
NITRITE UA: NEGATIVE
PH UA: 5.5
Protein Ur, POC: NEGATIVE
Spec Grav, UA: 1.02
Urobilinogen, UA: 0.2

## 2016-01-07 LAB — POCT CBC
Granulocyte percent: 57 %G (ref 37–80)
HCT, POC: 36.4 % — AB (ref 37.7–47.9)
HEMOGLOBIN: 12.6 g/dL (ref 12.2–16.2)
LYMPH, POC: 2.5 (ref 0.6–3.4)
MCH, POC: 29.4 pg (ref 27–31.2)
MCHC: 34.6 g/dL (ref 31.8–35.4)
MCV: 85 fL (ref 80–97)
MID (cbc): 0.6 (ref 0–0.9)
MPV: 7.7 fL (ref 0–99.8)
POC Granulocyte: 4.1 (ref 2–6.9)
POC LYMPH %: 34.7 % (ref 10–50)
POC MID %: 8.3 % (ref 0–12)
Platelet Count, POC: 270 10*3/uL (ref 142–424)
RBC: 4.28 M/uL (ref 4.04–5.48)
RDW, POC: 12.7 %
WBC: 7.2 10*3/uL (ref 4.6–10.2)

## 2016-01-07 LAB — POC MICROSCOPIC URINALYSIS (UMFC)

## 2016-01-07 MED ORDER — PREDNISONE 20 MG PO TABS: ORAL_TABLET | ORAL | 0 refills | Status: DC

## 2016-01-07 MED ORDER — NITROFURANTOIN MONOHYD MACRO 100 MG PO CAPS: 100.0000 mg | ORAL_CAPSULE | Freq: Two times a day (BID) | ORAL | 0 refills | Status: AC

## 2016-01-07 NOTE — Patient Instructions (Addendum)
Please make sure you are hydrating with 64 oz of water if not more. I would like you to take the prednisone as prescribed.  Please return if your symptoms do not improve.     IF you received an x-ray today, you will receive an invoice from [facility]. Please contact [facility] Radiology at [phone] with questions or concerns regarding your invoice.   IF you received labwork today, you will receive an invoice from [employer]. Please contact [facility] at [phone] with questions or concerns regarding your invoice.   Our billing staff will not be able to assist you with questions regarding bills from these companies.  You will be contacted with the lab results as soon as they are available. The fastest way to get your results is to activate your My Chart account. Instructions are located on the last page of this paperwork. If you have not heard from [place] regarding the results in 2 weeks, please contact this office.

## 2016-01-07 NOTE — Progress Notes (Signed)
[facility] [address], [place] [address] [phone]  By signing my name below I, Karla Castro, attest that this documentation has been prepared under the direction and in the presence of Karla Drape PA. Electonically Signed. Karla Castro, Scribe 01/07/2016 at 3:22 PM  Date:  01/07/2016   Name:  Karla Castro   DOB:  11-17-1965   MRN:  [id number]  PCP:  Pcp Not In System    History of Present Illness: Chief Complaint  Patient presents with   Follow-up    still coughing   Karla Castro is a 50 y.o. female patient who presents to [facility] for follow up of cough. She was seen here 11 days ago for cc of cold like symptoms. Treated with azithromycin. Pt states symptoms started to improve but cough worsened a few days ago. She finished entire course of abx. She reports violent coughing fits with metallic taste in her mouth. Cough is productive consisting of clear/yellow sputum, more during the day. She has hx of allergies but no hx of asthma. Pt reports sick contacts of her nephews who were diagnosed with walking pneumonia. At that time, 1 month ago,  she had been cleaning out the house she had to stay in, which was was filled with mouse feces. She denies SOB. Pt is not a smoker.   Pt also complains of of very slight dysuria and pressure. She suspects this may be UTI.    There are no active problems to display for this patient.   Past Medical History:  Diagnosis Date   Allergy     Past Surgical History:  Procedure Laterality Date   WRIST GANGLION EXCISION  1998    Social History  Substance Use Topics   Smoking status: Never Smoker   Smokeless tobacco: Never Used   Alcohol use No    Family History  Problem Relation Age of Onset   Arthritis Mother    Charcot-Marie-Tooth disease Mother    Osteoporosis Mother    Hypothyroidism Mother    Hypertension Father    Heart attack Maternal Grandmother    Emphysema Maternal Grandfather     Vision loss Maternal Grandfather    Kidney failure Paternal Grandmother    Hypertension Paternal Grandmother    Osteoporosis Paternal Grandmother    Arthritis Paternal Grandmother    Heart attack Paternal Grandfather     Allergies  Allergen Reactions   Sulfa Antibiotics Itching and Rash    Medication list has been reviewed and updated.  Current Outpatient Prescriptions on File Prior to Visit  Medication Sig Dispense Refill   GuaiFENesin (MUCINEX PO) Take by mouth.     No current facility-administered medications on file prior to visit.     Review of Systems  Constitutional: Positive for malaise/fatigue. Negative for chills and fever.  Respiratory: Positive for cough and sputum production. Negative for shortness of breath.   Gastrointestinal: Positive for abdominal pain. Negative for nausea and vomiting.  Genitourinary: Positive for dysuria.   ROS unremarkable unless otherwise specified.  Physical Examination: BP 110/68 (BP Location: Right Arm, Patient Position: Sitting, Cuff Size: Normal)    Pulse 74    Temp 98.2 F (36.8 C) (Oral)    Resp 16    Ht 5\' 6"  (1.676 m)    Wt 142 lb 9.6 oz (64.7 kg)    SpO2 98%    BMI 23.02 kg/m  Ideal Body Weight: @FLOWAMB [id number]  Physical Exam  Constitutional: She is oriented to person,  place, and time. She appears well-developed and well-nourished. No distress.  HENT:  Head: Normocephalic and atraumatic.  Right Ear: External ear normal.  Left Ear: External ear normal.  Nose: Rhinorrhea present. No mucosal edema.  Mouth/Throat: Oropharynx is clear and moist.  Eyes: Conjunctivae and EOM are normal. Pupils are equal, round, and reactive to light.  Neck: No thyromegaly present.  Cardiovascular: Normal rate.   Pulmonary/Chest: Effort normal. No respiratory distress.  Abdominal: There is tenderness in the suprapubic area. There is no CVA tenderness.  Lymphadenopathy:    She has no cervical adenopathy.  Neurological: She is alert  and oriented to person, place, and time.  Skin: She is not diaphoretic.  Psychiatric: She has a normal mood and affect. Her behavior is normal.   Dg Chest 2 View  Result Date: 01/07/2016 CLINICAL DATA:  Productive cough, fatigue, recent antibiotic treatment EXAM: CHEST  2 VIEW COMPARISON:  Chest x-ray of 08/20/2009 FINDINGS: No active infiltrate or effusion is seen. Mediastinal and hilar contours are unremarkable. The heart is within normal limits in size. No bony abnormality is seen. IMPRESSION: No active cardiopulmonary disease. Electronically Signed   By: Karla Castro M.D.   On: 01/07/2016 15:47    Assessment and Plan: Karla Castro is a 50 y.o. female who is here today for cough. Will treat with prednisone at this time.  Advised to return in 2 days, if her symptoms do not improve.  Cough - Plan: POCT urinalysis dipstick, POCT Microscopic Urinalysis ([facility]), DG Chest 2 View, POCT CBC  Dysuria - Plan: nitrofurantoin, macrocrystal-monohydrate, (MACROBID) 100 MG capsule  Bronchospasm - Plan: DG Chest 2 View, Urine culture, predniSONE (DELTASONE) 20 MG tablet  Karla Drape, PA-C [facility] 01/07/2016 3:22 PM

## 2016-01-09 LAB — URINE CULTURE: Organism ID, Bacteria: 10000

## 2016-01-13 DIAGNOSIS — Z1231 Encounter for screening mammogram for malignant neoplasm of breast: Secondary | ICD-10-CM | POA: Diagnosis not present

## 2016-01-13 DIAGNOSIS — Z01419 Encounter for gynecological examination (general) (routine) without abnormal findings: Secondary | ICD-10-CM | POA: Diagnosis not present

## 2016-01-23 ENCOUNTER — Encounter: Payer: Self-pay | Admitting: Physician Assistant

## 2016-01-23 DIAGNOSIS — N959 Unspecified menopausal and perimenopausal disorder: Secondary | ICD-10-CM | POA: Insufficient documentation

## 2016-05-20 ENCOUNTER — Other Ambulatory Visit: Payer: Self-pay | Admitting: Family Medicine

## 2016-06-11 ENCOUNTER — Ambulatory Visit (INDEPENDENT_AMBULATORY_CARE_PROVIDER_SITE_OTHER): Payer: BLUE CROSS/BLUE SHIELD | Admitting: Physician Assistant

## 2016-06-11 ENCOUNTER — Ambulatory Visit (INDEPENDENT_AMBULATORY_CARE_PROVIDER_SITE_OTHER): Payer: BLUE CROSS/BLUE SHIELD

## 2016-06-11 VITALS — BP 102/64 | HR 76 | Temp 98.3°F | Resp 14 | Ht 66.0 in | Wt 140.0 lb

## 2016-06-11 DIAGNOSIS — R1013 Epigastric pain: Secondary | ICD-10-CM

## 2016-06-11 DIAGNOSIS — Z23 Encounter for immunization: Secondary | ICD-10-CM

## 2016-06-11 DIAGNOSIS — R140 Abdominal distension (gaseous): Secondary | ICD-10-CM | POA: Diagnosis not present

## 2016-06-11 DIAGNOSIS — R109 Unspecified abdominal pain: Secondary | ICD-10-CM | POA: Diagnosis not present

## 2016-06-11 DIAGNOSIS — R112 Nausea with vomiting, unspecified: Secondary | ICD-10-CM | POA: Diagnosis not present

## 2016-06-11 MED ORDER — SUCRALFATE 1 G PO TABS: 1.0000 g | ORAL_TABLET | Freq: Three times a day (TID) | ORAL | 1 refills | Status: DC

## 2016-06-11 NOTE — Progress Notes (Signed)
Patient ID: Karla Castro, female    DOB: 1965-06-11, 51 y.o.   MRN: [id number]  PCP: Pcp Not In System  Chief Complaint  Patient presents with  . Abdominal Pain    w/nausea  . Flu Vaccine    Subjective:   Presents for evaluation of constant burning abdominal pan for about 18 months, with episodic worsening associated with nausea, dry heaves.  The pain is worse with orange juice and carbonated beverages, but no other identified triggers. Loss of appetite is really worry that eating will exacerbate her symptoms. Feels "blocked up and bloated," and has the sensation that belching and passing gas would help to ease her symptoms. OTC antacid, omeprazole and probiotics have been ineffective.  No weight loss, fever, chills, urinary symptoms. No personal or family history of GI or GU cancers. Family history of IBS.  From visit with Ms. Karla Castro 02/25/2015: This is a pleasant 51 yo female who presents with 4-6 week history of postprandial LUQ "discomfort." Over the last 3 weeks, she notices more of a "burn," this is worse at night when she lies down and worse with acidic foods and beverages. Pain 6/10 at it's worse. Pain is constant. Drank some kefir yesterday which gave her some relief. Has not taken any OTC medication. Feels like her gut has "slowed down" and has BM every other day which is less frequent for her. Her symptoms were preceded by a gastro illness. She was prescribed omeprazole, and advised to RTC in 1 month for re-evaluation, which she did not do. CBC, CMET, Amylase and Lipase were normal. Two visits 12/2015 for cough are also reviewed, in which this issue was not mentioned.   Review of Systems  Constitutional: Positive for appetite change (afraid to eat).  Respiratory: Negative for cough, shortness of breath and wheezing.   Cardiovascular: Negative for chest pain, palpitations and leg swelling.  Gastrointestinal: Positive for abdominal pain, constipation, nausea and  vomiting. Abdominal distention: bloating.  Endocrine: Negative for cold intolerance, heat intolerance, polydipsia, polyphagia and polyuria.  Genitourinary: Negative for dysuria, frequency, hematuria, menstrual problem and urgency.       Patient Active Problem List   Diagnosis Date Noted  . Premenopausal patient 01/23/2016     Prior to Admission medications   Medication Sig Start Date End Date Taking? Authorizing Provider  fluticasone (FLONASE) 50 MCG/ACT nasal spray Place into both nostrils daily.   Yes Historical Provider, MD  Melatonin 2.5 MG CAPS Take by mouth.   Yes Historical Provider, MD  omeprazole (PRILOSEC) 20 MG capsule Take 20 mg by mouth daily.   Yes Historical Provider, MD     Allergies  Allergen Reactions  . Sulfa Antibiotics Itching and Rash       Objective:  Physical Exam  Constitutional: She is oriented to person, place, and time. She appears well-developed and well-nourished. She is active and cooperative. No distress.  BP 102/64   Pulse 76   Temp 98.3 F (36.8 C) (Oral)   Resp 14   Ht 5\' 6"  (1.676 m)   Wt 140 lb (63.5 kg)   SpO2 98%   BMI 22.60 kg/m   HENT:  Head: Normocephalic and atraumatic.  Right Ear: Hearing normal.  Left Ear: Hearing normal.  Eyes: Conjunctivae are normal. No scleral icterus.  Neck: Normal range of motion. Neck supple. No thyromegaly present.  Cardiovascular: Normal rate, regular rhythm and normal heart sounds.   Pulses:      Radial pulses are 2+ on  the right side, and 2+ on the left side.  Pulmonary/Chest: Effort normal and breath sounds normal.  Abdominal: Soft. Normal appearance and bowel sounds are normal. She exhibits no distension and no mass. There is no hepatosplenomegaly. There is tenderness in the epigastric area. There is no rigidity, no rebound, no guarding, no tenderness at McBurney's point and negative Murphy's sign.  Lymphadenopathy:       Head (right side): No tonsillar, no preauricular, no posterior  auricular and no occipital adenopathy present.       Head (left side): No tonsillar, no preauricular, no posterior auricular and no occipital adenopathy present.    She has no cervical adenopathy.       Right: No supraclavicular adenopathy present.       Left: No supraclavicular adenopathy present.  Neurological: She is alert and oriented to person, place, and time. No sensory deficit.  Skin: Skin is warm, dry and intact. No rash noted. No cyanosis or erythema. Nails show no clubbing.  Psychiatric: She has a normal mood and affect. Her speech is normal and behavior is normal.    Dg Abd Acute W/chest  Result Date: 06/11/2016 CLINICAL DATA:  51 year old female with history of epigastric abdominal pain, bloating and feeling of constipation. EXAM: DG ABDOMEN ACUTE W/ 1V CHEST COMPARISON:  Chest x-ray 01/07/2016. FINDINGS: Lung volumes are normal. No consolidative airspace disease. No pleural effusions. No pneumothorax. No pulmonary nodule or mass noted. Pulmonary vasculature and the cardiomediastinal silhouette are within normal limits. Gas and stool are seen scattered throughout the colon extending to the level of the distal rectum. No pathologic distension of small bowel is noted. Relatively large volume of well-formed stool throughout the colon. IUD projecting over the low anatomic pelvis. No gross evidence of pneumoperitoneum. IMPRESSION: 1.  Nonobstructive bowel gas pattern. 2. No pneumoperitoneum. 3. Relatively large volume of well-formed stool throughout the colon may suggest mild constipation. 4. No radiographic evidence of acute cardiopulmonary disease. Electronically Signed   By: Vinnie Langton M.D.   On: 06/11/2016 10:36          Assessment & Plan:   1. Abdominal pain, epigastric 2. Nausea and vomiting, intractability of vomiting not specified, unspecified vomiting type 3. Abdominal bloating Reassuring radiographs. Trial of carafate and counseled on constipation remedies. Await CMET  and H pylori. If tests are normal and symptoms persist, plan RUQ [place] and possible GI referral. - H. pylori breath test - Comprehensive metabolic panel - DG Abd Acute W/Chest; Future - sucralfate (CARAFATE) 1 g tablet; Take 1 tablet (1 g total) by mouth 4 (four) times daily -  with meals and at bedtime.  Dispense: 30 tablet; Refill: 1  4. Needs flu shot - Flu Vaccine QUAD 36+ mos IM   Fara Chute, PA-C [facility]

## 2016-06-11 NOTE — Progress Notes (Signed)
     Patient ID: Karla Castro, female    DOB: 1966-04-16, 51 y.o.   MRN: [id number]  PCP: Pcp Not In System  Chief Complaint  Patient presents with  . Abdominal Pain    w/nausea  . Flu Vaccine    Subjective:   Presents for evaluation of epigastric abdominal pain.  Pt is a 51yo caucasian female who presents with epigastric abdominal pain. The pain has been present for about 1.5 years, came on gradually, and has worsened in the past few months. Pts states that the pain is located in the epigastrium and does not radiate to the back or to other areas of the abdomen. Pt states that she is constantly uncomfortable, but has "flare ups" of pain, nausea, anorexia, and dry heaving approximately every 1-1.5 months. Flare ups sometimes occur with food, sometimes not. The only identified triggers have been carbonated drinks and orange juice. Pt staes that she feels "bloated" and "blocked up" and like she needs to pass gas and/or burp. Has tried Tums, Omeprazole, and Probiotics with no relief. Family history is pertent for only IBS. She admits to occasional constipation. Denies fever, chills, fatigue, weight loss, skin changes, vomiting, hematochezia, or dysuria.    Review of Systems See HPI.    Patient Active Problem List   Diagnosis Date Noted  . Premenopausal patient 01/23/2016     Prior to Admission medications   Medication Sig Start Date End Date Taking? Authorizing Provider  fluticasone (FLONASE) 50 MCG/ACT nasal spray Place into both nostrils daily.   Yes Historical Provider, MD  omeprazole (PRILOSEC) 20 MG capsule Take 20 mg by mouth daily.   Yes Historical Provider, MD     Allergies  Allergen Reactions  . Sulfa Antibiotics Itching and Rash       Objective:  Physical Exam HEENT: PERRLA. No scleral icterus. Throat is nonerythematous, no exudates. Pulm: Good respiratory effort. CTAB. No wheezes, rales, or rhonchi. CV: RRR. No M/R/G. Abd: Discomfort to palpation of the mid  epigastrum. Abdomen soft, nondistended. + BS x 4 quadrants. Negative Murphy's sign. No hepatosplenomegaly.  Skin: No rashes or jaundice.    Assessment & Plan:   1. Abdominal pain, epigastric Xray reveals relatively large volume of well-formed stool thought the colon. Pt advised to continue Omeprazole and add Carafate pending H. Pylori Breath Test and CMP results. Pt advised to drinking plenty of water, consume fibrous foods, and try OTC Miralax for relief of constipation and bloating. - Comprehensive metabolic panel - DG Abd Acute W/Chest; Future - sucralfate (CARAFATE) 1 g tablet; Take 1 tablet (1 g total) by mouth 4 (four) times daily -  with meals and at bedtime.  Dispense: 30 tablet; Refill: 1  2. Nausea and vomiting, intractability of vomiting not specified, unspecified vomiting type - H. pylori breath test - sucralfate (CARAFATE) 1 g tablet; Take 1 tablet (1 g total) by mouth 4 (four) times daily -  with meals and at bedtime.  Dispense: 30 tablet; Refill: 1  3. Abdominal bloating  4. Needs flu shot - Flu Vaccine QUAD 36+ mos IM   Lorella Nimrod, PA-S

## 2016-06-11 NOTE — Patient Instructions (Addendum)
To help reduce constipation and promote bowel health, 1. Drink at least 64 ounces of water each day; 2. Eat plenty of fiber (fruits, vegetables, whole grains, legumes) 3. Get plenty of physical activity If needed, use a stool softener (docusate) or an osmotic laxative (like Miralax) each day, or as needed.       IF you received an x-ray today, you will receive an invoice from [facility] Radiology. Please contact [facility]iology at [phone] with questions or concerns regarding your invoice.   IF you received labwork today, you will receive an invoice from [facility]. Please contact [facility] at [phone] with questions or concerns regarding your invoice.   Our billing staff will not be able to assist you with questions regarding bills from these companies.  You will be contacted with the lab results as soon as they are available. The fastest way to get your results is to activate your My Chart account. Instructions are located on the last page of this paperwork. If you have not heard from [place] regarding the results in 2 weeks, please contact this office.

## 2016-06-12 LAB — COMPREHENSIVE METABOLIC PANEL
ALT: 19 IU/L (ref 0–32)
AST: 18 IU/L (ref 0–40)
Albumin/Globulin Ratio: 1.9 (ref 1.2–2.2)
Albumin: 4.9 g/dL (ref 3.5–5.5)
Alkaline Phosphatase: 86 IU/L (ref 39–117)
BILIRUBIN TOTAL: 0.3 mg/dL (ref 0.0–1.2)
BUN/Creatinine Ratio: 18 (ref 9–23)
BUN: 12 mg/dL (ref 6–24)
CALCIUM: 9.9 mg/dL (ref 8.7–10.2)
CHLORIDE: 100 mmol/L (ref 96–106)
CO2: 27 mmol/L (ref 18–29)
CREATININE: 0.68 mg/dL (ref 0.57–1.00)
GFR calc non Af Amer: 103 mL/min/{1.73_m2} (ref >59)
GFR, EST AFRICAN AMERICAN: 119 mL/min/{1.73_m2} (ref >59)
GLUCOSE: 90 mg/dL (ref 65–99)
Globulin, Total: 2.6 g/dL (ref 1.5–4.5)
Potassium: 4.3 mmol/L (ref 3.5–5.2)
Sodium: 141 mmol/L (ref 134–144)
TOTAL PROTEIN: 7.5 g/dL (ref 6.0–8.5)

## 2016-06-16 LAB — H. PYLORI BREATH TEST

## 2016-06-16 LAB — H.PYLORI BREATH TEST (REFLEX): H. PYLORI BREATH TEST: NEGATIVE

## 2016-06-18 ENCOUNTER — Encounter: Payer: Self-pay | Admitting: Physician Assistant

## 2016-06-21 ENCOUNTER — Encounter: Payer: Self-pay | Admitting: Physician Assistant

## 2016-06-21 DIAGNOSIS — G8929 Other chronic pain: Secondary | ICD-10-CM

## 2016-06-21 DIAGNOSIS — R1013 Epigastric pain: Principal | ICD-10-CM

## 2016-06-22 ENCOUNTER — Encounter: Payer: Self-pay | Admitting: Gastroenterology

## 2016-07-30 ENCOUNTER — Ambulatory Visit: Payer: Self-pay | Admitting: Gastroenterology

## 2016-09-06 ENCOUNTER — Ambulatory Visit (INDEPENDENT_AMBULATORY_CARE_PROVIDER_SITE_OTHER): Payer: BLUE CROSS/BLUE SHIELD | Admitting: Gastroenterology

## 2016-09-06 ENCOUNTER — Encounter: Payer: Self-pay | Admitting: Gastroenterology

## 2016-09-06 VITALS — BP 110/74 | HR 67 | Ht 66.0 in | Wt 136.0 lb

## 2016-09-06 DIAGNOSIS — G8929 Other chronic pain: Secondary | ICD-10-CM | POA: Diagnosis not present

## 2016-09-06 DIAGNOSIS — R1013 Epigastric pain: Secondary | ICD-10-CM | POA: Diagnosis not present

## 2016-09-06 DIAGNOSIS — K5900 Constipation, unspecified: Secondary | ICD-10-CM | POA: Diagnosis not present

## 2016-09-06 NOTE — Patient Instructions (Addendum)
Please start taking citrucel (orange flavored) powder fiber supplement.  This may cause some bloating at first but that usually goes away. Begin with a small spoonful and work your way up to a large, heaping spoonful daily over a week. Stay hydrated. If this isn't helpful, will have to consider colonoscopy. Call in 4 weeks to report on your response. Recall colonoscopy at age 51 (Jan 2019). Avoid NSAIDs Take omeprazole 20mg  pill with any future NSAIDs. If your pains return, please call and will consider upper endosccopy.

## 2016-09-06 NOTE — Progress Notes (Signed)
HPI: This is a  very pleasant 51 year old woman  who was referred to me by Harrison Mons, PA-C  to evaluate  epigastric abdominal pain, lower abdominal pain .    Chief complaint is epigastric abdominal pain, also lower abdominal pain and chronic constipation   burning, pressure pain,  Continuous for about a year.  She tried omeprazole 20mg  and that did not help. Changed to carafate and after a month or so the pain improved.  Pain can return periodic, with coke?  The pain was worse with eating, and also at night when laying dose.  Rare caffeine.  Overall stable weight.  Can feel bloated.    She noticed less BMs weekly.  She took senna type.  Another stool softner.  No real improvement.  Then modified her diet with increased fruits, veggies.  Often has to push, strain to move her bowels.  NO bleeding.  No dysphagia.  She will take ibuprofin but not daily.  Old Data Reviewed: Labs January 2018: Complete metabolic profile was normal H pylori breath test was negative    Review of systems: Pertinent positive and negative review of systems were noted in the above HPI section. Complete review of systems was performed and was otherwise normal.   Past Medical History:  Diagnosis Date  . Allergy     Past Surgical History:  Procedure Laterality Date  . WRIST GANGLION EXCISION  1998    Current Outpatient Prescriptions  Medication Sig Dispense Refill  . fluticasone (FLONASE) 50 MCG/ACT nasal spray Place into both nostrils daily.    . Melatonin 2.5 MG CAPS Take by mouth.     No current facility-administered medications for this visit.     Allergies as of 09/06/2016 - Review Complete 09/06/2016  Allergen Reaction Noted  . Sulfa antibiotics Itching and Rash 01/24/2013    Family History  Problem Relation Age of Onset  . Arthritis Mother   . Charcot-Marie-Tooth disease Mother   . Osteoporosis Mother   . Hypothyroidism Mother   . Hypertension Father   . Heart attack  Maternal Grandmother   . Emphysema Maternal Grandfather   . Vision loss Maternal Grandfather   . Kidney failure Paternal Grandmother   . Hypertension Paternal Grandmother   . Osteoporosis Paternal Grandmother   . Arthritis Paternal Grandmother   . Heart attack Paternal Grandfather     Social History   Social History  . Marital status: Married    Spouse name: N/A  . Number of children: 2  . Years of education: N/A   Occupational History  . Not on file.   Social History Main Topics  . Smoking status: Never Smoker  . Smokeless tobacco: Never Used  . Alcohol use No  . Drug use: No  . Sexual activity: Not on file   Other Topics Concern  . Not on file   Social History Narrative   Lives with her spouse and 2 sons.     Physical Exam: Ht 5\' 6"  (1.676 m)   Wt 136 lb (61.7 kg)   BMI 21.95 kg/m  Constitutional: generally well-appearing Psychiatric: alert and oriented x3 Eyes: extraocular movements intact Mouth: oral pharynx moist, no lesions Neck: supple no lymphadenopathy Cardiovascular: heart regular rate and rhythm Lungs: clear to auscultation bilaterally Abdomen: soft, nontender, nondistended, no obvious ascites, no peritoneal signs, normal bowel sounds Extremities: no lower extremity edema bilaterally Skin: no lesions on visible extremities   Assessment and plan: 51 y.o. female with  Epigastric abdominal pain, chronic mild constipation  First she has mild chronic constipation and some fullness in her lower abdomen. I do not think this is anything pathologic. I recommended she try fiber supplements and stay hydrated. She will call to report on her response in 4 weeks and if things are not improved with these conservative measures and possibly I would recommend colonoscopy. She is not due for screening examination for about another year. Second her epigastric burning really seems like it was related to a shoulder injury about a year and a half ago for which she was  taking 200-400 mg of ibuprofen about twice daily for many months. Her symptoms did not really improve with omeprazole 20 mg a day but when she stops taking the ibuprofen and also started taking Carafate her symptoms improved. This makes a good story for NSAID related GI symptoms. We discussed NSAIDs. She will try Tylenol in the future for routine aches and pains. If she does need an NSAID here and there that I recommended she try taking it with an over-the-counter proton time pump inhibitor. She knows to call here if she has significant epigastric burning, abdominal pains in the future if she does and perhaps we would proceed with EGD at that time. She has no alarm symptoms that warrant upper endoscopy at this point.    Please see the "Patient Instructions" section for addition details about the plan.   Owens Loffler, MD [facility] 09/06/2016, 2:35 PM  Cc: Harrison Mons, PA-C

## 2016-10-13 DIAGNOSIS — M25612 Stiffness of left shoulder, not elsewhere classified: Secondary | ICD-10-CM | POA: Diagnosis not present

## 2016-10-13 DIAGNOSIS — M25512 Pain in left shoulder: Secondary | ICD-10-CM | POA: Diagnosis not present

## 2016-10-13 DIAGNOSIS — M545 Low back pain: Secondary | ICD-10-CM | POA: Diagnosis not present

## 2016-10-20 DIAGNOSIS — M25612 Stiffness of left shoulder, not elsewhere classified: Secondary | ICD-10-CM | POA: Diagnosis not present

## 2016-10-20 DIAGNOSIS — M25512 Pain in left shoulder: Secondary | ICD-10-CM | POA: Diagnosis not present

## 2016-10-20 DIAGNOSIS — M545 Low back pain: Secondary | ICD-10-CM | POA: Diagnosis not present

## 2016-10-25 DIAGNOSIS — M25512 Pain in left shoulder: Secondary | ICD-10-CM | POA: Diagnosis not present

## 2016-10-25 DIAGNOSIS — M545 Low back pain: Secondary | ICD-10-CM | POA: Diagnosis not present

## 2016-10-25 DIAGNOSIS — M25612 Stiffness of left shoulder, not elsewhere classified: Secondary | ICD-10-CM | POA: Diagnosis not present

## 2017-03-29 ENCOUNTER — Ambulatory Visit (INDEPENDENT_AMBULATORY_CARE_PROVIDER_SITE_OTHER): Payer: BLUE CROSS/BLUE SHIELD | Admitting: Emergency Medicine

## 2017-03-29 DIAGNOSIS — Z23 Encounter for immunization: Secondary | ICD-10-CM | POA: Diagnosis not present

## 2017-04-05 DIAGNOSIS — M545 Low back pain: Secondary | ICD-10-CM | POA: Diagnosis not present

## 2017-04-05 DIAGNOSIS — M25512 Pain in left shoulder: Secondary | ICD-10-CM | POA: Diagnosis not present

## 2017-04-05 DIAGNOSIS — M25612 Stiffness of left shoulder, not elsewhere classified: Secondary | ICD-10-CM | POA: Diagnosis not present

## 2017-06-08 ENCOUNTER — Encounter: Payer: Self-pay | Admitting: Gastroenterology

## 2017-08-22 ENCOUNTER — Encounter: Payer: Self-pay | Admitting: Physician Assistant

## 2017-09-07 DIAGNOSIS — Z9189 Other specified personal risk factors, not elsewhere classified: Secondary | ICD-10-CM | POA: Diagnosis not present

## 2017-09-07 DIAGNOSIS — Z01419 Encounter for gynecological examination (general) (routine) without abnormal findings: Secondary | ICD-10-CM | POA: Diagnosis not present

## 2017-09-07 DIAGNOSIS — Z1231 Encounter for screening mammogram for malignant neoplasm of breast: Secondary | ICD-10-CM | POA: Diagnosis not present

## 2017-09-07 DIAGNOSIS — Z124 Encounter for screening for malignant neoplasm of cervix: Secondary | ICD-10-CM | POA: Diagnosis not present

## 2017-09-07 DIAGNOSIS — Z6822 Body mass index (BMI) 22.0-22.9, adult: Secondary | ICD-10-CM | POA: Diagnosis not present

## 2017-09-07 DIAGNOSIS — R102 Pelvic and perineal pain: Secondary | ICD-10-CM | POA: Diagnosis not present

## 2017-10-13 ENCOUNTER — Other Ambulatory Visit: Payer: Self-pay | Admitting: Physician Assistant

## 2017-10-13 DIAGNOSIS — D229 Melanocytic nevi, unspecified: Secondary | ICD-10-CM | POA: Diagnosis not present

## 2017-10-13 DIAGNOSIS — L918 Other hypertrophic disorders of the skin: Secondary | ICD-10-CM | POA: Diagnosis not present

## 2017-10-13 DIAGNOSIS — D485 Neoplasm of uncertain behavior of skin: Secondary | ICD-10-CM | POA: Diagnosis not present

## 2017-10-13 HISTORY — DX: Melanocytic nevi, unspecified: D22.9

## 2017-10-27 DIAGNOSIS — M546 Pain in thoracic spine: Secondary | ICD-10-CM | POA: Diagnosis not present

## 2017-10-29 ENCOUNTER — Encounter: Payer: Self-pay | Admitting: Physician Assistant

## 2017-12-04 DIAGNOSIS — W268XXA Contact with other sharp object(s), not elsewhere classified, initial encounter: Secondary | ICD-10-CM | POA: Diagnosis not present

## 2017-12-04 DIAGNOSIS — S80811A Abrasion, right lower leg, initial encounter: Secondary | ICD-10-CM | POA: Diagnosis not present

## 2017-12-04 DIAGNOSIS — Y92098 Other place in other non-institutional residence as the place of occurrence of the external cause: Secondary | ICD-10-CM | POA: Diagnosis not present

## 2017-12-04 DIAGNOSIS — Y93E9 Activity, other interior property and clothing maintenance: Secondary | ICD-10-CM | POA: Diagnosis not present

## 2017-12-04 DIAGNOSIS — Z23 Encounter for immunization: Secondary | ICD-10-CM | POA: Diagnosis not present

## 2018-02-15 ENCOUNTER — Encounter: Payer: Self-pay | Admitting: Gastroenterology

## 2018-02-17 DIAGNOSIS — M546 Pain in thoracic spine: Secondary | ICD-10-CM | POA: Diagnosis not present

## 2018-03-23 DIAGNOSIS — M546 Pain in thoracic spine: Secondary | ICD-10-CM | POA: Diagnosis not present

## 2018-03-24 ENCOUNTER — Encounter: Payer: Self-pay | Admitting: Gastroenterology

## 2018-03-24 ENCOUNTER — Ambulatory Visit (AMBULATORY_SURGERY_CENTER): Payer: Self-pay | Admitting: *Deleted

## 2018-03-24 ENCOUNTER — Telehealth: Payer: Self-pay | Admitting: *Deleted

## 2018-03-24 VITALS — Ht 66.75 in | Wt 140.0 lb

## 2018-03-24 DIAGNOSIS — Z800 Family history of malignant neoplasm of digestive organs: Secondary | ICD-10-CM

## 2018-03-24 MED ORDER — PEG 3350-KCL-NA BICARB-NACL 420 G PO SOLR: 4000.0000 mL | Freq: Once | ORAL | 0 refills | Status: AC

## 2018-03-24 NOTE — Telephone Encounter (Signed)
Karla Castro,  This pt has Charcot- Marie Tooth disease and she states she has some sedation that is contraindicated with this.  Is she safe with propofol- please advise- she is scheduled for 11-22 Friday with Debera Lat, Lelan Pons

## 2018-03-24 NOTE — Progress Notes (Signed)
No egg or soy allergy known to patient  No issues with past sedation with any surgeries  or procedures, no intubation problems  No diet pills per patient No home 02 use per patient  No blood thinners per patient  Pt denies issues with constipation  No A fib or A flutter  EMMI video sent to pt's e mail  Pt has charcot- Marie Tooth disease - mother and son with this as well.  Note to Nulty to be sure okay with propofol

## 2018-03-24 NOTE — Telephone Encounter (Signed)
Dr Ardis Hughs,  Please review John's note.  With this information, Do you want her to have an OV with you before a colon since she has NO GI history.  Family hx rectal cancer in  her mother.    Mom was just diagnosed 12-2017. Pt had a lot of questions in PV this morning.  Please advise direct at hospital or OV.  Thanks so much for your time, Lelan Pons

## 2018-03-24 NOTE — Telephone Encounter (Signed)
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  Their case should be done at the hospital.  Thanks,  Osvaldo Angst

## 2018-03-24 NOTE — Telephone Encounter (Signed)
Office visit first.  thanks 

## 2018-03-27 DIAGNOSIS — G600 Hereditary motor and sensory neuropathy: Secondary | ICD-10-CM | POA: Diagnosis not present

## 2018-03-27 DIAGNOSIS — Z800 Family history of malignant neoplasm of digestive organs: Secondary | ICD-10-CM | POA: Diagnosis not present

## 2018-03-27 DIAGNOSIS — Z13220 Encounter for screening for lipoid disorders: Secondary | ICD-10-CM | POA: Diagnosis not present

## 2018-03-27 DIAGNOSIS — Z23 Encounter for immunization: Secondary | ICD-10-CM | POA: Diagnosis not present

## 2018-03-27 DIAGNOSIS — Z0000 Encounter for general adult medical examination without abnormal findings: Secondary | ICD-10-CM | POA: Diagnosis not present

## 2018-03-27 NOTE — Telephone Encounter (Signed)
Informed pt of John and Dr Ardis Hughs recommendation-- Canceled 11-22 colon and made an OV with DR Ardis Hughs for 12-9 at 10 am, pt to arrive 945 am 3rd floor.  Lelan Pons PV

## 2018-03-29 DIAGNOSIS — M546 Pain in thoracic spine: Secondary | ICD-10-CM | POA: Diagnosis not present

## 2018-04-07 ENCOUNTER — Encounter: Payer: BLUE CROSS/BLUE SHIELD | Admitting: Gastroenterology

## 2018-04-24 ENCOUNTER — Ambulatory Visit (INDEPENDENT_AMBULATORY_CARE_PROVIDER_SITE_OTHER): Payer: BLUE CROSS/BLUE SHIELD | Admitting: Gastroenterology

## 2018-04-24 ENCOUNTER — Encounter: Payer: Self-pay | Admitting: Gastroenterology

## 2018-04-24 VITALS — BP 96/68 | HR 72 | Ht 66.0 in | Wt 138.0 lb

## 2018-04-24 DIAGNOSIS — Z1211 Encounter for screening for malignant neoplasm of colon: Secondary | ICD-10-CM

## 2018-04-24 NOTE — Progress Notes (Signed)
HPI: This is a very pleasant 52 year old woman whom I am meeting for the first time today.  She was originally referred for consideration of colonoscopy for colon cancer screening.  Her mother was diagnosed with rectal cancer at the age of 29.  No other family history of colon cancer.  She has no GI symptoms.  Specifically no constipation diarrhea or bleeding or abdominal pains.  She has Charcot-Marie-Tooth.  Her mother was very symptomatic from it and her 64 year old son was recently diagnosed with it and is having foot issues already from it.  She really does not have any specific symptoms of it however she has had issues with recovery from sedation.  It takes quite a long time for her to recover from just minor sedation.  Chief complaint is routine risk for colon cancer   Review of systems: Pertinent positive and negative review of systems were noted in the above HPI section. All other review negative.   Past Medical History:  Diagnosis Date  . Allergy   . Charcot-Marie-Tooth disease   . Constipation    occ metamucil  . Cyst of ovary 02/13/2003    Past Surgical History:  Procedure Laterality Date  . WRIST GANGLION EXCISION Left 1998    Current Outpatient Medications  Medication Sig Dispense Refill  . levonorgestrel (MIRENA, 52 MG,) 20 MCG/24HR IUD Mirena 20 mcg/24 hours (5 yrs) 52 mg intrauterine device  Take 1 device by intrauterine route.    . Melatonin 2.5 MG CAPS Take by mouth.     No current facility-administered medications for this visit.     Allergies as of 04/24/2018 - Review Complete 04/24/2018  Allergen Reaction Noted  . Sulfa antibiotics Itching and Rash 01/24/2013    Family History  Problem Relation Age of Onset  . Arthritis Mother   . Charcot-Marie-Tooth disease Mother   . Osteoporosis Mother   . Hypothyroidism Mother   . Rectal cancer Mother 27       dx'd 12-2017  . Hypertension Father   . Heart attack Maternal Grandmother   . Emphysema Maternal  Grandfather   . Vision loss Maternal Grandfather   . Kidney failure Paternal Grandmother   . Hypertension Paternal Grandmother   . Osteoporosis Paternal Grandmother   . Arthritis Paternal Grandmother   . Heart attack Paternal Grandfather   . Colon cancer Neg Hx   . Colon polyps Neg Hx   . Esophageal cancer Neg Hx   . Stomach cancer Neg Hx     Social History   Socioeconomic History  . Marital status: Married    Spouse name: Not on file  . Number of children: 2  . Years of education: Not on file  . Highest education level: Not on file  Occupational History  . Occupation: Warden/ranger  Social Needs  . Financial resource strain: Not on file  . Food insecurity:    Worry: Not on file    Inability: Not on file  . Transportation needs:    Medical: Not on file    Non-medical: Not on file  Tobacco Use  . Smoking status: Never Smoker  . Smokeless tobacco: Never Used  Substance and Sexual Activity  . Alcohol use: No  . Drug use: No  . Sexual activity: Not on file  Lifestyle  . Physical activity:    Days per week: Not on file    Minutes per session: Not on file  . Stress: Not on file  Relationships  . Social connections:  Talks on phone: Not on file    Gets together: Not on file    Attends religious service: Not on file    Active member of club or organization: Not on file    Attends meetings of clubs or organizations: Not on file    Relationship status: Not on file  . Intimate partner violence:    Fear of current or ex partner: Not on file    Emotionally abused: Not on file    Physically abused: Not on file    Forced sexual activity: Not on file  Other Topics Concern  . Not on file  Social History Narrative   Lives with her spouse and 2 sons.     Physical Exam: BP 96/68 (BP Location: Left Arm, Patient Position: Sitting, Cuff Size: Normal)   Pulse 72   Ht 5\' 6"  (1.676 m) Comment: height measured without shoes  Wt 138 lb (62.6 kg)   BMI 22.27 kg/m   Constitutional: generally well-appearing Psychiatric: alert and oriented x3 Eyes: extraocular movements intact Mouth: oral pharynx moist, no lesions Neck: supple no lymphadenopathy Cardiovascular: heart regular rate and rhythm Lungs: clear to auscultation bilaterally Abdomen: soft, nontender, nondistended, no obvious ascites, no peritoneal signs, normal bowel sounds Extremities: no lower extremity edema bilaterally Skin: no lesions on visible extremities   Assessment and plan: 52 y.o. female with routine risk for colon cancer  We had a very nice discussion about colon cancer screening.  She did have a single family member, her mother who was diagnosed in her mid to late 80s with colon cancer.  It was rectal cancer.  That does not significantly increase her risk for colon cancer and I would consider her to be still at routine risk.  She has Charcot-Marie-Tooth and has had issues with waking up from sedation in the past and so I think it would be safest if we do initial screening measures with a stool base test.  She completely agrees with this.  We will go ahead with a Cologuard stool based colon cancer screening test and pending that result she will either need colonoscopy now or repeat Cologuard in 3 years.    Please see the "Patient Instructions" section for addition details about the plan.   Owens Loffler, MD [facility] 04/24/2018, 10:25 AM

## 2018-04-24 NOTE — Patient Instructions (Addendum)
Cologuard colon cancer screening test.  Your provider has ordered Cologuard testing as an option for colon cancer screening. This is performed by [employer] and may be out of network with your insurance. PRIOR to completing the test, it is YOUR responsibility to contact your insurance about covered benefits for this test. Your out of pocket expense could be anywhere from $0.00 to $649.00.   When you call to check coverage with your insurer, please provide the following information:   -The ONLY provider of Cologuard is [place] code for Cologuard is [phone].  [employer] NPI # [id number]  -[employer] Tax ID # [id number]   We have already sent your demographic and insurance information to [employer] (phone number [phone]) and they should contact you within the next week regarding your test. If you have not heard from them within the next week, please call our office at [phone].  Thank you for entrusting me with your care and choosing [facility].  Dr Ardis Hughs

## 2018-06-23 DIAGNOSIS — F439 Reaction to severe stress, unspecified: Secondary | ICD-10-CM | POA: Diagnosis not present

## 2018-06-23 DIAGNOSIS — J019 Acute sinusitis, unspecified: Secondary | ICD-10-CM | POA: Diagnosis not present

## 2018-06-29 ENCOUNTER — Telehealth: Payer: Self-pay

## 2018-06-29 NOTE — Telephone Encounter (Signed)
I spoke to patient to discus  when she was planning to do her Cologuard teat as indicated in her last office visit 04/24/18. She stated she has been under the weather for the past few weeks, and said she was going to complete the test next week. Will wait on results.

## 2018-07-20 DIAGNOSIS — F439 Reaction to severe stress, unspecified: Secondary | ICD-10-CM | POA: Diagnosis not present

## 2018-07-24 DIAGNOSIS — Z1211 Encounter for screening for malignant neoplasm of colon: Secondary | ICD-10-CM | POA: Diagnosis not present

## 2018-07-24 DIAGNOSIS — Z1212 Encounter for screening for malignant neoplasm of rectum: Secondary | ICD-10-CM | POA: Diagnosis not present

## 2018-07-24 LAB — COLOGUARD: COLOGUARD: NEGATIVE

## 2018-07-28 ENCOUNTER — Other Ambulatory Visit: Payer: Self-pay

## 2018-09-05 ENCOUNTER — Telehealth: Payer: Self-pay | Admitting: Gastroenterology

## 2018-09-05 NOTE — Telephone Encounter (Signed)
Left message for patient to call back  

## 2018-09-05 NOTE — Telephone Encounter (Signed)
Spoke to patient informed her of her cologuard results. Patient voiced understanding

## 2018-09-05 NOTE — Telephone Encounter (Signed)
Please let her know that her Cologuard colon cancer screening test was negative.  She needs recall Cologuard in 3 years.  Thank you

## 2018-10-13 DIAGNOSIS — M546 Pain in thoracic spine: Secondary | ICD-10-CM | POA: Diagnosis not present

## 2018-10-23 DIAGNOSIS — H52223 Regular astigmatism, bilateral: Secondary | ICD-10-CM | POA: Diagnosis not present

## 2018-10-23 DIAGNOSIS — H5203 Hypermetropia, bilateral: Secondary | ICD-10-CM | POA: Diagnosis not present

## 2018-10-23 DIAGNOSIS — H524 Presbyopia: Secondary | ICD-10-CM | POA: Diagnosis not present

## 2019-01-03 DIAGNOSIS — M546 Pain in thoracic spine: Secondary | ICD-10-CM | POA: Diagnosis not present

## 2019-01-09 DIAGNOSIS — N912 Amenorrhea, unspecified: Secondary | ICD-10-CM | POA: Diagnosis not present

## 2019-01-09 DIAGNOSIS — Z1231 Encounter for screening mammogram for malignant neoplasm of breast: Secondary | ICD-10-CM | POA: Diagnosis not present

## 2019-01-09 DIAGNOSIS — Z01419 Encounter for gynecological examination (general) (routine) without abnormal findings: Secondary | ICD-10-CM | POA: Diagnosis not present

## 2019-01-09 DIAGNOSIS — Z6822 Body mass index (BMI) 22.0-22.9, adult: Secondary | ICD-10-CM | POA: Diagnosis not present

## 2019-01-12 DIAGNOSIS — M546 Pain in thoracic spine: Secondary | ICD-10-CM | POA: Diagnosis not present

## 2019-01-15 ENCOUNTER — Encounter: Payer: Self-pay | Admitting: *Deleted

## 2019-01-15 DIAGNOSIS — M546 Pain in thoracic spine: Secondary | ICD-10-CM | POA: Diagnosis not present

## 2019-07-01 NOTE — H&P (View-Only) (Signed)
     07/01/2019 Karla Castro [id number] 1966-01-10   Chief Complaint: Constipation   History of Present Illness: Karla Castro is a 54 year old female with a past medical history of Charcot-Marie-Tooth disease and constipation. She complains of having constipation, does not completely since the end of Dec. 2020. She reports passing small balls of stool. She previously, passed normal formed brown BMs. Abdomen often feels bloated and distended. She took Citrucel and a stool softener without improvement. She took Dulcolax 2 tabs about 3 weeks ago and she passed several large solid stools. She took Dulcolax 1 tab one week ago and she passed a normal BM. No rectal bleeding or melena. She is menopausal confirmed by blood tests done 01/2019. IUD in place. She completed a Cologuard test 07/24/2018 which was negative. She was advised by Dr. Ardis Hughs to schedule a colonoscopy in 3 years. Mother with history of rectal cancer. She stated having prolonged sedation after having wrist ganglion cyst  surgery in 1998.  She complains of having tension headaches for the past month.   Current Medications, Allergies, Past Medical History, Past Surgical History, Family History and Social History were reviewed in [employer] record.   Physical Exam: BP 90/72   Temp 98.5 F (36.9 C)   Ht 5\' 6"  (1.676 m)   Wt 140 lb (63.5 kg)   BMI 22.60 kg/m  General: Well developed 55 year old female in no acute distress. Head: Normocephalic and atraumatic. Eyes:  No scleral icterus. Conjunctiva pink . Ears: Normal auditory acuity. Mouth: Dentition intact. No ulcers or lesins.  Neck: No lymphadenopathy or thyromegaly.  Lungs: Clear throughout to auscultation. Heart: Regular rate and rhythm, no murmur. Abdomen: Soft, nondistended. RLQ tenderness with thickened component stool vs mass palpated. No rebound or guarding.  Normal bowel sounds x 4 quadrants.  Rectal: Deferred.  Musculoskeletal: Symmetrical  with no gross deformities. Extremities: No edema. Neurological: Alert oriented x 4. No focal deficits.  Psychological:  Alert and cooperative. Normal mood and affect  Assessment and Recommendations:  76. 54 year old female with constipation. RLQ tenderness on exam with thickened area palpated stool vs mass.  -Abdominal/pelvic CT with oral and IV contrast  -CBC, CMP, TSH, CRP, IgA and tTG -Colonoscopy benefits and risks discussed including risk with sedation, risk of bleeding, perforation and infection  -Colonoscopy to be scheduled at [facility] due to history of prolonged sedation with past surgery with history of  Charcot-Marie-Tooth -Mirlax 1 capful mixed in 8 ounces of water at bed time. Dulcolax 1 to 2 tabs po Q 3rd night PRN -Further follow up to be determined after the above evaluation completed   2. Family history of rectal cancer (mother)

## 2019-07-01 NOTE — Progress Notes (Signed)
     07/01/2019 GLENROSE NAIK [id number] 1966-03-28   Chief Complaint: Constipation   History of Present Illness: Karla Castro is a 54 year old female with a past medical history of Charcot-Marie-Tooth disease and constipation. She complains of having constipation, does not completely since the end of Dec. 2020. She reports passing small balls of stool. She previously, passed normal formed brown BMs. Abdomen often feels bloated and distended. She took Citrucel and a stool softener without improvement. She took Dulcolax 2 tabs about 3 weeks ago and she passed several large solid stools. She took Dulcolax 1 tab one week ago and she passed a normal BM. No rectal bleeding or melena. She is menopausal confirmed by blood tests done 01/2019. IUD in place. She completed a Cologuard test 07/24/2018 which was negative. She was advised by Dr. Ardis Hughs to schedule a colonoscopy in 3 years. Mother with history of rectal cancer. She stated having prolonged sedation after having wrist ganglion cyst  surgery in 1998.  She complains of having tension headaches for the past month.   Current Medications, Allergies, Past Medical History, Past Surgical History, Family History and Social History were reviewed in [employer] record.   Physical Exam: BP 90/72   Temp 98.5 F (36.9 C)   Ht 5\' 6"  (1.676 m)   Wt 140 lb (63.5 kg)   BMI 22.60 kg/m  General: Well developed 54 year old female in no acute distress. Head: Normocephalic and atraumatic. Eyes:  No scleral icterus. Conjunctiva pink . Ears: Normal auditory acuity. Mouth: Dentition intact. No ulcers or lesins.  Neck: No lymphadenopathy or thyromegaly.  Lungs: Clear throughout to auscultation. Heart: Regular rate and rhythm, no murmur. Abdomen: Soft, nondistended. RLQ tenderness with thickened component stool vs mass palpated. No rebound or guarding.  Normal bowel sounds x 4 quadrants.  Rectal: Deferred.  Musculoskeletal: Symmetrical  with no gross deformities. Extremities: No edema. Neurological: Alert oriented x 4. No focal deficits.  Psychological:  Alert and cooperative. Normal mood and affect  Assessment and Recommendations:  30. 54 year old female with constipation. RLQ tenderness on exam with thickened area palpated stool vs mass.  -Abdominal/pelvic CT with oral and IV contrast  -CBC, CMP, TSH, CRP, IgA and tTG -Colonoscopy benefits and risks discussed including risk with sedation, risk of bleeding, perforation and infection  -Colonoscopy to be scheduled at [facility] due to history of prolonged sedation with past surgery with history of  Charcot-Marie-Tooth -Mirlax 1 capful mixed in 8 ounces of water at bed time. Dulcolax 1 to 2 tabs po Q 3rd night PRN -Further follow up to be determined after the above evaluation completed   2. Family history of rectal cancer (mother)

## 2019-07-02 ENCOUNTER — Encounter: Payer: Self-pay | Admitting: Nurse Practitioner

## 2019-07-02 ENCOUNTER — Telehealth: Payer: Self-pay | Admitting: General Surgery

## 2019-07-02 ENCOUNTER — Other Ambulatory Visit (INDEPENDENT_AMBULATORY_CARE_PROVIDER_SITE_OTHER): Payer: BC Managed Care – PPO

## 2019-07-02 ENCOUNTER — Other Ambulatory Visit: Payer: Self-pay

## 2019-07-02 ENCOUNTER — Ambulatory Visit (INDEPENDENT_AMBULATORY_CARE_PROVIDER_SITE_OTHER): Payer: BC Managed Care – PPO | Admitting: Nurse Practitioner

## 2019-07-02 VITALS — BP 90/72 | Temp 98.5°F | Ht 66.0 in | Wt 140.0 lb

## 2019-07-02 DIAGNOSIS — G600 Hereditary motor and sensory neuropathy: Secondary | ICD-10-CM | POA: Diagnosis not present

## 2019-07-02 DIAGNOSIS — Z800 Family history of malignant neoplasm of digestive organs: Secondary | ICD-10-CM | POA: Diagnosis not present

## 2019-07-02 DIAGNOSIS — K5900 Constipation, unspecified: Secondary | ICD-10-CM | POA: Insufficient documentation

## 2019-07-02 DIAGNOSIS — R1031 Right lower quadrant pain: Secondary | ICD-10-CM | POA: Diagnosis not present

## 2019-07-02 LAB — COMPREHENSIVE METABOLIC PANEL
ALT: 13 U/L (ref 0–35)
AST: 18 U/L (ref 0–37)
Albumin: 4.7 g/dL (ref 3.5–5.2)
Alkaline Phosphatase: 89 U/L (ref 39–117)
BUN: 12 mg/dL (ref 6–23)
CO2: 29 mEq/L (ref 19–32)
Calcium: 9.9 mg/dL (ref 8.4–10.5)
Chloride: 99 mEq/L (ref 96–112)
Creatinine, Ser: 0.73 mg/dL (ref 0.40–1.20)
GFR: 83.68 mL/min (ref >60.00)
Glucose, Bld: 91 mg/dL (ref 70–99)
Potassium: 4.1 mEq/L (ref 3.5–5.1)
Sodium: 136 mEq/L (ref 135–145)
Total Bilirubin: 0.3 mg/dL (ref 0.2–1.2)
Total Protein: 7.8 g/dL (ref 6.0–8.3)

## 2019-07-02 LAB — CBC WITH DIFFERENTIAL/PLATELET
Basophils Absolute: 0 10*3/uL (ref 0.0–0.1)
Basophils Relative: 0.5 % (ref 0.0–3.0)
Eosinophils Absolute: 0 10*3/uL (ref 0.0–0.7)
Eosinophils Relative: 0.6 % (ref 0.0–5.0)
HCT: 36.2 % (ref 36.0–46.0)
Hemoglobin: 12.1 g/dL (ref 12.0–15.0)
Lymphocytes Relative: 38.2 % (ref 12.0–46.0)
Lymphs Abs: 1.9 10*3/uL (ref 0.7–4.0)
MCHC: 33.5 g/dL (ref 30.0–36.0)
MCV: 86.4 fl (ref 78.0–100.0)
Monocytes Absolute: 0.6 10*3/uL (ref 0.1–1.0)
Monocytes Relative: 12.7 % — ABNORMAL HIGH (ref 3.0–12.0)
Neutro Abs: 2.4 10*3/uL (ref 1.4–7.7)
Neutrophils Relative %: 48 % (ref 43.0–77.0)
Platelets: 255 10*3/uL (ref 150.0–400.0)
RBC: 4.19 Mil/uL (ref 3.87–5.11)
RDW: 13.2 % (ref 11.5–15.5)
WBC: 5 10*3/uL (ref 4.0–10.5)

## 2019-07-02 LAB — C-REACTIVE PROTEIN: CRP: <1 mg/dL (ref 0.5–20.0)

## 2019-07-02 LAB — TSH: TSH: 2.85 u[IU]/mL (ref 0.35–4.50)

## 2019-07-02 LAB — IGA: IgA: 126 mg/dL (ref 68–378)

## 2019-07-02 MED ORDER — NA SULFATE-K SULFATE-MG SULF 17.5-3.13-1.6 GM/177ML PO SOLN: 1.0000 | Freq: Once | ORAL | 0 refills | Status: AC

## 2019-07-02 NOTE — Telephone Encounter (Signed)
Left a detailed message on the patients voicemail that her colonoscopy was cancelled on 07/12/2019 at [place]. That I will call her back to reschedule as soon as I get a date

## 2019-07-02 NOTE — Patient Instructions (Addendum)
If you are age 52 or older, your body mass index should be between 23-30. Your Body mass index is 22.6 kg/m. If this is out of the aforementioned range listed, please consider follow up with your Primary Care Provider.  If you are age 57 or younger, your body mass index should be between 19-25. Your Body mass index is 22.6 kg/m. If this is out of the aformentioned range listed, please consider follow up with your Primary Care Provider.     You are scheduled on 07/09/2019 at [facility]. At [facility] should arrive 15 minutes prior to your appointment time for registration. Please follow the written instructions below on the day of your exam:  WARNING: IF YOU ARE ALLERGIC TO IODINE/X-RAY DYE, PLEASE NOTIFY RADIOLOGY IMMEDIATELY AT [phone]! YOU WILL BE GIVEN A 13 HOUR PREMEDICATION PREP.  1) Do not eat or drink anything after 4:00am (4 hours prior to your test) 2) You have been given 2 bottles of oral contrast to drink. The solution may taste better if refrigerated, but do NOT add ice or any other liquid to this solution. Shake well before drinking.    Drink 1 bottle of contrast @ 6:00 am (2 hours prior to your exam)  Drink 1 bottle of contrast @ 7:00 am (1 hour prior to your exam)  You may take any medications as prescribed with a small amount of water, if necessary. If you take any of the following medications: METFORMIN, GLUCOPHAGE, GLUCOVANCE, AVANDAMET, RIOMET, FORTAMET, Bell MET, JANUMET, GLUMETZA or METAGLIP, you MAY be asked to HOLD this medication 48 hours AFTER the exam.  The purpose of you drinking the oral contrast is to aid in the visualization of your intestinal tract. The contrast solution may cause some diarrhea. Depending on your individual set of symptoms, you may also receive an intravenous injection of x-ray contrast/dye. Plan on being at [facility] for 30 minutes or longer, depending on the type of exam you are having performed.  This test typically  takes 30-45 minutes to complete.  If you have any questions regarding your exam or if you need to reschedule, you may call the CT department between the hours of 8:00 am and 5:00 pm, Monday-Friday.   We have sent the following medications to your pharmacy for you to pick up at your convenience:  [place] provider has requested that you go to the basement level for lab work before leaving today. Press "B" on the elevator. The lab is located at the first door on the left as you exit the elevator. ________________________________________________________________________ .Please take 1 capful of Mirilax in and 8 ounce glass of water at bedtime. 1-2 ducolax tablets every 3rd day at night as needed.  The night before you start your prep for the colonoscopy please take 2 ducolax tablets.  Due to recent changes in healthcare laws, you may see the results of your imaging and laboratory studies on MyChart before your provider has had a chance to review them.  We understand that in some cases there may be results that are confusing or concerning to you. Not all laboratory results come back in the same time frame and the provider may be waiting for multiple results in order to interpret others.  Please give [place] 48 hours in order for your provider to thoroughly review all the results before contacting the office for clarification of your results.

## 2019-07-03 ENCOUNTER — Telehealth: Payer: Self-pay | Admitting: General Surgery

## 2019-07-03 LAB — TISSUE TRANSGLUTAMINASE, IGA: (tTG) Ab, IgA: 1 U/mL

## 2019-07-03 NOTE — Telephone Encounter (Signed)
See my chart msg on lab report

## 2019-07-03 NOTE — Progress Notes (Signed)
I agree with the above note, plan 

## 2019-07-03 NOTE — Telephone Encounter (Signed)
Contacted the patient and left a voicemail that we are able to reschedule the appointment for her colonoscopy on 07/26/2019 per Dr Ardis Hughs.

## 2019-07-04 ENCOUNTER — Other Ambulatory Visit: Payer: Self-pay | Admitting: General Surgery

## 2019-07-04 ENCOUNTER — Encounter: Payer: Self-pay | Admitting: General Surgery

## 2019-07-04 ENCOUNTER — Telehealth: Payer: Self-pay | Admitting: Nurse Practitioner

## 2019-07-04 DIAGNOSIS — G600 Hereditary motor and sensory neuropathy: Secondary | ICD-10-CM

## 2019-07-04 DIAGNOSIS — Z800 Family history of malignant neoplasm of digestive organs: Secondary | ICD-10-CM

## 2019-07-04 DIAGNOSIS — K5900 Constipation, unspecified: Secondary | ICD-10-CM

## 2019-07-04 DIAGNOSIS — R1031 Right lower quadrant pain: Secondary | ICD-10-CM

## 2019-07-04 MED ORDER — CLENPIQ 10-3.5-12 MG-GM -GM/160ML PO SOLN: 1.0000 | ORAL | 0 refills | Status: DC

## 2019-07-04 NOTE — Telephone Encounter (Signed)
Karla Castro with [employer] called to inform that pt wants to have imaging test at [facility]. Order needs to be faxed to [phone].

## 2019-07-04 NOTE — Telephone Encounter (Signed)
Left a voicemail on Mr Karla Castro phone to have Shalice contact the office regarding her cx procedure at [facility]

## 2019-07-06 ENCOUNTER — Telehealth: Payer: Self-pay

## 2019-07-06 NOTE — Telephone Encounter (Signed)
Called patient was unable to leave voicemail due to mailbox being full. PA was asked for clenpiq. Wanted to see if patient has tried to get prep from [facility] or not

## 2019-07-06 NOTE — Telephone Encounter (Signed)
Order has been faxed to [facility] at number provided by [facility] and pre cert has also been changed in Epic for this facility;

## 2019-07-09 ENCOUNTER — Telehealth: Payer: Self-pay | Admitting: General Surgery

## 2019-07-09 ENCOUNTER — Ambulatory Visit (HOSPITAL_COMMUNITY): Payer: BC Managed Care – PPO

## 2019-07-09 NOTE — Telephone Encounter (Signed)
Contacted the patient and left a voicemail that the patients insurance will not cover Clenpiq. clenpiq sample left at front desk (elam) with instructions.

## 2019-07-11 ENCOUNTER — Telehealth: Payer: Self-pay | Admitting: Nurse Practitioner

## 2019-07-11 ENCOUNTER — Encounter: Payer: Self-pay | Admitting: General Surgery

## 2019-07-11 NOTE — Telephone Encounter (Signed)
Karla Castro has returned a call to the office- order for CT scan needs to be re faxed to [facility] as they have not received the fax as of yet;  Order has been faxed at this time;

## 2019-07-11 NOTE — Telephone Encounter (Signed)
Tried to contact the patient back. The patients line went straight to voicemail and I was unable to leave a message. A voicemail was left for the patient on 07/09/2019 that a sample of clenpiq was left at the front desk. That is why she received the instructions for Clenpiq. I was unaware that she was changed to suprep, not seeing where this was sent to pharmacy

## 2019-07-11 NOTE — Telephone Encounter (Signed)
Pt called to inform that she got a letter to pick up clenpiq. However, pt states that prep was changed to suprep, which she already picked up from her pharmacy. Pt is confused, she also states that she does not have instructions for suprep. Pls call her.

## 2019-07-12 ENCOUNTER — Encounter (HOSPITAL_COMMUNITY): Payer: Self-pay

## 2019-07-12 ENCOUNTER — Ambulatory Visit (HOSPITAL_COMMUNITY): Admit: 2019-07-12 | Payer: BC Managed Care – PPO | Admitting: Gastroenterology

## 2019-07-12 ENCOUNTER — Telehealth: Payer: Self-pay | Admitting: Nurse Practitioner

## 2019-07-12 SURGERY — COLONOSCOPY WITH PROPOFOL
Anesthesia: Monitor Anesthesia Care

## 2019-07-12 NOTE — Telephone Encounter (Signed)
[employer] called stating that the order for CT scan need to be signed by the provider and re-faxed

## 2019-07-12 NOTE — Telephone Encounter (Signed)
Left a message on the patients voicemail to call the office. I need to clarify her prep and directions.

## 2019-07-17 NOTE — Telephone Encounter (Signed)
Patient is calling back also has concerns with the contrast that was given to her for CT she said radiology told her it was the wrong solution

## 2019-07-17 NOTE — Telephone Encounter (Signed)
Tried to contact the patient back and again received her voicemail. Left a message for her to call

## 2019-07-18 ENCOUNTER — Encounter: Payer: Self-pay | Admitting: General Surgery

## 2019-07-18 NOTE — Telephone Encounter (Signed)
Left message for the patient to contact the office. 

## 2019-07-19 NOTE — Progress Notes (Signed)
Error

## 2019-07-23 ENCOUNTER — Other Ambulatory Visit (HOSPITAL_COMMUNITY)
Admission: RE | Admit: 2019-07-23 | Discharge: 2019-07-23 | Disposition: A | Discharge status: Home / Self Care | Payer: BC Managed Care – PPO | Source: Ambulatory Visit | Attending: Gastroenterology | Admitting: Gastroenterology

## 2019-07-23 DIAGNOSIS — Z20822 Contact with and (suspected) exposure to covid-19: Secondary | ICD-10-CM | POA: Insufficient documentation

## 2019-07-23 DIAGNOSIS — Z01812 Encounter for preprocedural laboratory examination: Secondary | ICD-10-CM | POA: Diagnosis present

## 2019-07-23 LAB — SARS CORONAVIRUS 2 (TAT 6-24 HRS): SARS Coronavirus 2: NEGATIVE

## 2019-07-26 ENCOUNTER — Ambulatory Visit (HOSPITAL_COMMUNITY)
Admission: RE | Admit: 2019-07-26 | Discharge: 2019-07-26 | Disposition: A | Discharge status: Home / Self Care | Payer: BC Managed Care – PPO | Attending: Gastroenterology | Admitting: Gastroenterology

## 2019-07-26 ENCOUNTER — Other Ambulatory Visit: Payer: Self-pay

## 2019-07-26 ENCOUNTER — Ambulatory Visit (HOSPITAL_COMMUNITY): Payer: BC Managed Care – PPO | Admitting: Certified Registered"

## 2019-07-26 ENCOUNTER — Encounter (HOSPITAL_COMMUNITY)
Admission: RE | Disposition: A | Discharge status: Home / Self Care | Payer: Self-pay | Source: Home / Self Care | Attending: Gastroenterology

## 2019-07-26 ENCOUNTER — Encounter (HOSPITAL_COMMUNITY): Payer: Self-pay | Admitting: Gastroenterology

## 2019-07-26 DIAGNOSIS — Z800 Family history of malignant neoplasm of digestive organs: Secondary | ICD-10-CM | POA: Diagnosis not present

## 2019-07-26 DIAGNOSIS — K5900 Constipation, unspecified: Secondary | ICD-10-CM | POA: Diagnosis present

## 2019-07-26 DIAGNOSIS — K644 Residual hemorrhoidal skin tags: Secondary | ICD-10-CM | POA: Diagnosis not present

## 2019-07-26 DIAGNOSIS — G600 Hereditary motor and sensory neuropathy: Secondary | ICD-10-CM | POA: Diagnosis not present

## 2019-07-26 DIAGNOSIS — K648 Other hemorrhoids: Secondary | ICD-10-CM | POA: Diagnosis not present

## 2019-07-26 DIAGNOSIS — R1031 Right lower quadrant pain: Secondary | ICD-10-CM

## 2019-07-26 HISTORY — DX: Other specified health status: Z78.9

## 2019-07-26 HISTORY — PX: COLONOSCOPY WITH PROPOFOL: SHX5780

## 2019-07-26 SURGERY — COLONOSCOPY WITH PROPOFOL
Anesthesia: Monitor Anesthesia Care

## 2019-07-26 MED ORDER — PROPOFOL 10 MG/ML IV BOLUS
INTRAVENOUS | Status: AC
  Filled 2019-07-26: qty 20

## 2019-07-26 MED ORDER — PROPOFOL 10 MG/ML IV BOLUS
INTRAVENOUS | Status: DC | PRN
  Administered 2019-07-26: 40 mg via INTRAVENOUS

## 2019-07-26 MED ORDER — LACTATED RINGERS IV SOLN
INTRAVENOUS | Status: DC
  Administered 2019-07-26: 1000 mL via INTRAVENOUS

## 2019-07-26 MED ORDER — PROPOFOL 500 MG/50ML IV EMUL
INTRAVENOUS | Status: AC
  Filled 2019-07-26: qty 50

## 2019-07-26 MED ORDER — SODIUM CHLORIDE 0.9 % IV SOLN: INTRAVENOUS | Status: DC

## 2019-07-26 MED ORDER — LIDOCAINE 2% (20 MG/ML) 5 ML SYRINGE
INTRAMUSCULAR | Status: DC | PRN
  Administered 2019-07-26: 40 mg via INTRAVENOUS

## 2019-07-26 MED ORDER — PROPOFOL 500 MG/50ML IV EMUL
INTRAVENOUS | Status: DC | PRN
  Administered 2019-07-26: 150 ug/kg/min via INTRAVENOUS

## 2019-07-26 SURGICAL SUPPLY — 22 items

## 2019-07-26 NOTE — Interval H&P Note (Signed)
History and Physical Interval Note:  07/26/2019 10:51 AM  Karla Castro  has presented today for surgery, with the diagnosis of constipation, family hx of rectal cancer RLQ pain,Charot-Marie tooth disease.  The various methods of treatment have been discussed with the patient and family. After consideration of risks, benefits and other options for treatment, the patient has consented to  Procedure(s): COLONOSCOPY WITH PROPOFOL (N/A) as a surgical intervention.  The patient's history has been reviewed, patient examined, no change in status, stable for surgery.  I have reviewed the patient's chart and labs.  Questions were answered to the patient's satisfaction.     Milus Banister

## 2019-07-26 NOTE — Anesthesia Preprocedure Evaluation (Addendum)
Anesthesia Evaluation  Patient identified by MRN, date of birth, ID band Patient awake    Reviewed: Allergy & Precautions, NPO status , Patient's Chart, lab work & pertinent test results  Airway Mallampati: I  TM Distance: >3 FB Neck ROM: Full    Dental no notable dental hx. (+) Teeth Intact, Dental Advisory Given   Pulmonary neg pulmonary ROS,    Pulmonary exam normal breath sounds clear to auscultation       Cardiovascular negative cardio ROS Normal cardiovascular exam Rhythm:Regular Rate:Normal     Neuro/Psych PSYCHIATRIC DISORDERS Depression H/o Charcot Marie Tooth  Neuromuscular disease    GI/Hepatic negative GI ROS, Neg liver ROS,   Endo/Other  negative endocrine ROS  Renal/GU negative Renal ROS  negative genitourinary   Musculoskeletal negative musculoskeletal ROS (+)   Abdominal   Peds  Hematology negative hematology ROS (+)   Anesthesia Other Findings Colonoscopy for constipation, family hx of rectal cancer RLQ pain  Reproductive/Obstetrics                            Anesthesia Physical Anesthesia Plan  ASA: II  Anesthesia Plan: MAC   Post-op Pain Management:    Induction: Intravenous  PONV Risk Score and Plan: 2 and Propofol infusion and Treatment may vary due to age or medical condition  Airway Management Planned: Natural Airway  Additional Equipment:   Intra-op Plan:   Post-operative Plan:   Informed Consent: I have reviewed the patients History and Physical, chart, labs and discussed the procedure including the risks, benefits and alternatives for the proposed anesthesia with the patient or authorized representative who has indicated his/her understanding and acceptance.     Dental advisory given  Plan Discussed with: CRNA  Anesthesia Plan Comments:         Anesthesia Quick Evaluation

## 2019-07-26 NOTE — Interval H&P Note (Signed)
History and Physical Interval Note:  07/26/2019 11:02 AM  Karla Castro  has presented today for surgery, with the diagnosis of constipation, family hx of rectal cancer RLQ pain,Charot-Marie tooth disease.  The various methods of treatment have been discussed with the patient and family. After consideration of risks, benefits and other options for treatment, the patient has consented to  Procedure(s): COLONOSCOPY WITH PROPOFOL (N/A) as a surgical intervention.  The patient's history has been reviewed, patient examined, no change in status, stable for surgery.  I have reviewed the patient's chart and labs.  Questions were answered to the patient's satisfaction.     Milus Banister

## 2019-07-26 NOTE — Transfer of Care (Signed)
Immediate Anesthesia Transfer of Care Note  Patient: Karla Castro  Procedure(s) Performed: COLONOSCOPY WITH PROPOFOL (N/A )  Patient Location: PACU and Endoscopy Unit  Anesthesia Type:MAC  Level of Consciousness: awake, alert  and oriented  Airway & Oxygen Therapy: Patient Spontanous Breathing and Patient connected to face mask oxygen  Post-op Assessment: Report given to RN and Post -op Vital signs reviewed and stable  Post vital signs: Reviewed and stable  Last Vitals:  Vitals Value Taken Time  BP 93/53 07/26/19 1334  Temp    Pulse 65 07/26/19 1335  Resp 13 07/26/19 1335  SpO2 100 % 07/26/19 1335  Vitals shown include unvalidated device data.  Last Pain:  Vitals:   07/26/19 1117  TempSrc: Oral         Complications: No apparent anesthesia complications

## 2019-07-26 NOTE — Anesthesia Procedure Notes (Signed)
Procedure Name: MAC Date/Time: 07/26/2019 1:10 PM Performed by: Eben Burow, CRNA Pre-anesthesia Checklist: Patient identified, Emergency Drugs available, Suction available, Patient being monitored and Timeout performed Oxygen Delivery Method: Simple face mask Dental Injury: Teeth and Oropharynx as per pre-operative assessment

## 2019-07-26 NOTE — Discharge Instructions (Signed)

## 2019-07-26 NOTE — Op Note (Signed)
[facility] Patient Name: Karla Castro Procedure Date: 07/26/2019 MRN: [id number] Attending MD: Milus Banister , MD Date of Birth: January 21, 1966 CSN: [id number] Age: 54 Admit Type: Outpatient Procedure:                Colonoscopy Indications:              Constipation, FH of colon (rectal) cancer, her                            mother had colon cancer Providers:                Milus Banister, MD, Debi Mays, RN, Hazel Sams Referring MD:              Medicines:                Monitored Anesthesia Care Complications:            No immediate complications. Estimated blood loss:                            None. Estimated Blood Loss:     Estimated blood loss: none. Procedure:                Pre-Anesthesia Assessment:                           - Prior to the procedure, a History and Physical                            was performed, and patient medications and                            allergies were reviewed. The patient's tolerance of                            previous anesthesia was also reviewed. The risks                            and benefits of the procedure and the sedation                            options and risks were discussed with the patient.                            All questions were answered, and informed consent                            was obtained. Prior Anticoagulants: The patient has                            taken no previous anticoagulant or antiplatelet                            agents. ASA Grade Assessment: II -  A patient with                            mild systemic disease. After reviewing the risks                            and benefits, the patient was deemed in                            satisfactory condition to undergo the procedure.                           After obtaining informed consent, the colonoscope                            was passed under direct vision. Throughout the              procedure, the patient's blood pressure, pulse, and                            oxygen saturations were monitored continuously. The                            [id number]) Olympus colonoscope was                            introduced through the anus and advanced to the the                            cecum, identified by appendiceal orifice and                            ileocecal valve. The colonoscopy was performed                            without difficulty. The patient tolerated the                            procedure well. The quality of the bowel                            preparation was good. Scope In: 1:13:27 PM Scope Out: 1:26:53 PM Scope Withdrawal Time: 0 hours 9 minutes 11 seconds  Total Procedure Duration: 0 hours 13 minutes 26 seconds  Findings:      External and internal hemorrhoids were found. The hemorrhoids were small.      The exam was otherwise without abnormality on direct and retroflexion       views. Impression:               - External and internal hemorrhoids.                           - The examination was otherwise normal on direct  and retroflexion views.                           - No polyps or cancers. Moderate Sedation:      Not Applicable - Patient had care per Anesthesia. Recommendation:           - Patient has a contact number available for                            emergencies. The signs and symptoms of potential                            delayed complications were discussed with the                            patient. Return to normal activities tomorrow.                            Written discharge instructions were provided to the                            patient.                           - Resume previous diet.                           - Continue present medications.                           - Repeat colonoscopy in 5 years for screening. Procedure Code(s):        --- Professional ---                            [phone], Colonoscopy, flexible; diagnostic, including                            collection of specimen(s) by brushing or washing,                            when performed (separate procedure) Diagnosis Code(s):        --- Professional ---                           K64.8, Other hemorrhoids                           K59.00, Constipation, unspecified CPT copyright 2019 [employer]. All rights reserved. The codes documented in this report are preliminary and upon coder review may  be revised to meet current compliance requirements. Milus Banister, MD 07/26/2019 1:29:30 PM This report has been signed electronically. Number of Addenda: 0

## 2019-07-27 ENCOUNTER — Encounter: Payer: Self-pay | Admitting: *Deleted

## 2019-07-27 NOTE — Anesthesia Postprocedure Evaluation (Signed)
Anesthesia Post Note  Patient: ARYANNE GILLELAND  Procedure(s) Performed: COLONOSCOPY WITH PROPOFOL (N/A )     Patient location during evaluation: Endoscopy Anesthesia Type: MAC Level of consciousness: awake and alert Pain management: pain level controlled Vital Signs Assessment: post-procedure vital signs reviewed and stable Respiratory status: spontaneous breathing, nonlabored ventilation, respiratory function stable and patient connected to nasal cannula oxygen Cardiovascular status: blood pressure returned to baseline and stable Postop Assessment: no apparent nausea or vomiting Anesthetic complications: no    Last Vitals:  Vitals:   07/26/19 1340 07/26/19 1350  BP: (!) 100/58 118/63  Pulse: 64 (!) 59  Resp: (!) 21 20  Temp:    SpO2: 100% 100%    Last Pain:  Vitals:   07/26/19 1350  TempSrc:   PainSc: 0-No pain                 Kallie Depolo L Antonette Hendricks

## 2019-08-01 ENCOUNTER — Other Ambulatory Visit: Payer: Self-pay

## 2019-08-01 ENCOUNTER — Encounter: Payer: Self-pay | Admitting: Physician Assistant

## 2019-08-01 ENCOUNTER — Ambulatory Visit (INDEPENDENT_AMBULATORY_CARE_PROVIDER_SITE_OTHER): Payer: BC Managed Care – PPO | Admitting: Physician Assistant

## 2019-08-01 DIAGNOSIS — Z1283 Encounter for screening for malignant neoplasm of skin: Secondary | ICD-10-CM

## 2019-08-01 DIAGNOSIS — I781 Nevus, non-neoplastic: Secondary | ICD-10-CM | POA: Diagnosis not present

## 2019-08-01 DIAGNOSIS — L918 Other hypertrophic disorders of the skin: Secondary | ICD-10-CM

## 2019-08-01 MED ORDER — CLOCORTOLONE PIVALATE 0.1 % EX CREA: 1.0000 "application " | TOPICAL_CREAM | Freq: Two times a day (BID) | CUTANEOUS | 0 refills | Status: AC

## 2019-08-01 NOTE — Progress Notes (Addendum)
   Follow-Up Visit   Subjective  Karla Castro is a 54 y.o. female who presents for the following: Annual Exam (re check right inguinal fold. Mild atypia) and Skin Tag (on back under bra line).  The following portions of the chart were reviewed this encounter and updated as appropriate: Tobacco  Allergies  Meds  Problems  Med Hx  Surg Hx  Fam Hx      Review of Systems: No other skin or systemic complaints.  Objective  Well appearing patient in no apparent distress; mood and affect are within normal limits.  A full examination was performed including scalp, head, eyes, ears, nose, lips, neck, chest, axillae, abdomen, back, buttocks, bilateral upper extremities, bilateral lower extremities, hands, feet, fingers, toes, fingernails, and toenails. All findings within normal limits unless otherwise noted below.  Objective  Right Malar Cheek: Solar lentigo-brown macule  Objective  Right Lower Back: Fleshy, skin-colored sessile and pedunculated papules.    Objective  Right Buccal Cheek : X3 electrocautery  Assessment & Plan  Screening exam for skin cancer Right Malar Cheek  LN2  Skin tag Right Lower Back  Permission and risks were attained. Tags were removed with scissors. Hemostasis attained with aluminum chloride. Patient tolerated procedures well.  Telangiectasia Right Buccal Cheek  Performed electrocautery x3

## 2021-02-17 ENCOUNTER — Other Ambulatory Visit: Payer: Self-pay | Admitting: Orthopedic Surgery

## 2021-02-17 DIAGNOSIS — M238X2 Other internal derangements of left knee: Secondary | ICD-10-CM

## 2021-02-17 DIAGNOSIS — M25562 Pain in left knee: Secondary | ICD-10-CM

## 2021-02-17 DIAGNOSIS — S8992XA Unspecified injury of left lower leg, initial encounter: Secondary | ICD-10-CM

## 2021-03-11 ENCOUNTER — Ambulatory Visit
Admission: RE | Admit: 2021-03-11 | Discharge: 2021-03-11 | Disposition: A | Discharge status: Home / Self Care | Payer: BC Managed Care – PPO | Source: Ambulatory Visit | Attending: Orthopedic Surgery | Admitting: Orthopedic Surgery

## 2021-03-11 ENCOUNTER — Other Ambulatory Visit: Payer: Self-pay

## 2021-03-11 DIAGNOSIS — M25562 Pain in left knee: Secondary | ICD-10-CM

## 2021-03-11 DIAGNOSIS — M238X2 Other internal derangements of left knee: Secondary | ICD-10-CM

## 2021-03-11 DIAGNOSIS — S8992XA Unspecified injury of left lower leg, initial encounter: Secondary | ICD-10-CM

## 2023-10-03 IMAGING — MR MR KNEE*L* W/O CM
4 of 7 series · 21 of 40 positions shown · non-contrast
Comparison: None.

CLINICAL DATA: Patient reports that she was tackled from the left
lateral side of the knee by 2 dogs in November 2020. Assess anterior
cruciate ligament and medial collateral ligament tear. No history of
surgery reported.

EXAM:
MRI OF THE LEFT KNEE WITHOUT CONTRAST
TECHNIQUE: Multiplanar, multisequence MR imaging of the knee was performed. No
intravenous contrast was administered.

[Series 6: T2 fat-sat · axial · 4.0mm · 0.50mm/px · z∈[-82,+33]mm · 4 of 24 slices shown]
[im 1/24]
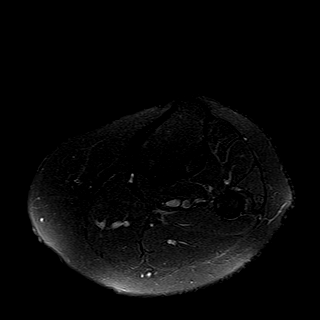
[im 6/24]
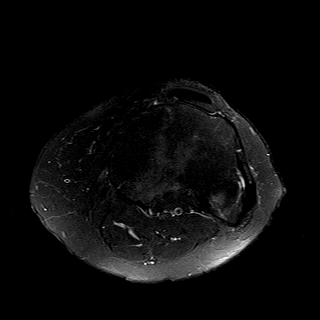
[im 12/24]
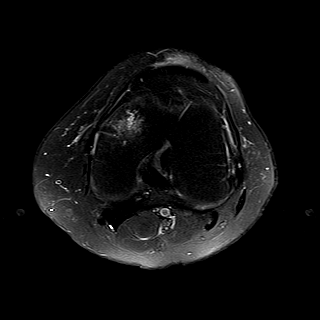
[im 24/24]
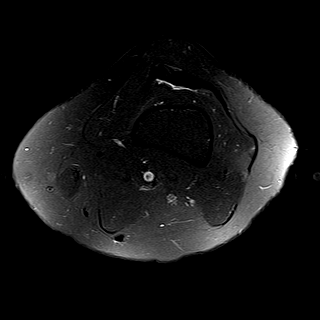

[Series 10: PD fat-sat · sagittal · 3.0mm · 0.29mm/px · 6 of 24 slices shown (1 of 3)]
[im 1/24]
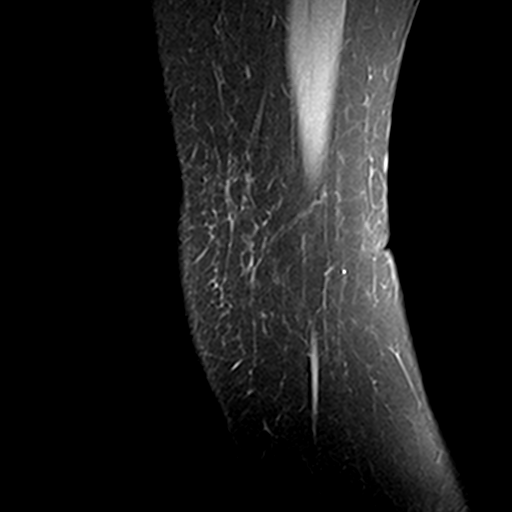
[im 5/24]
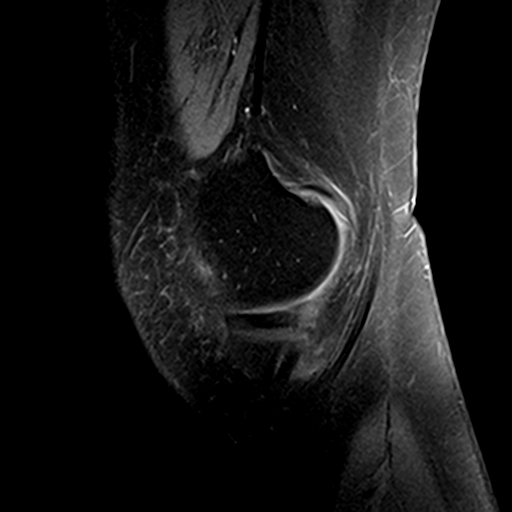
[im 10/24]
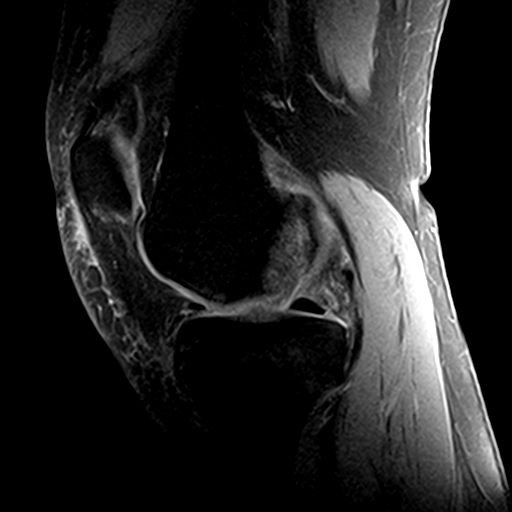
[im 14/24]
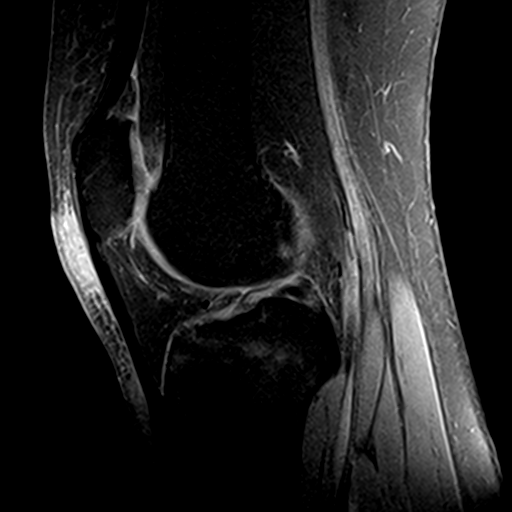
[im 19/24]
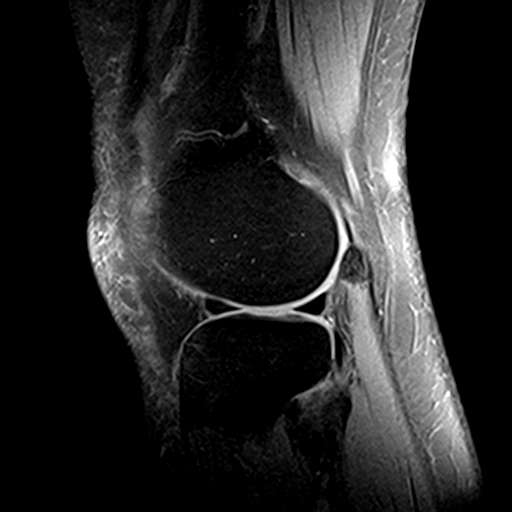
[im 24/24]
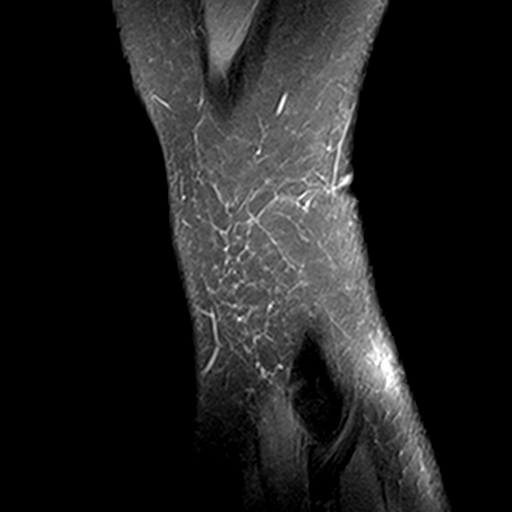

[Series 11: PD fat-sat · coronal · 3.0mm · 0.29mm/px · 7 of 27 slices shown (2 of 3)]
[im 1/27]
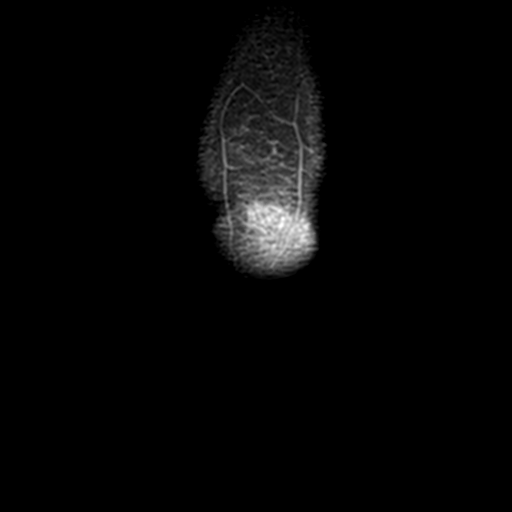
[im 5/27]
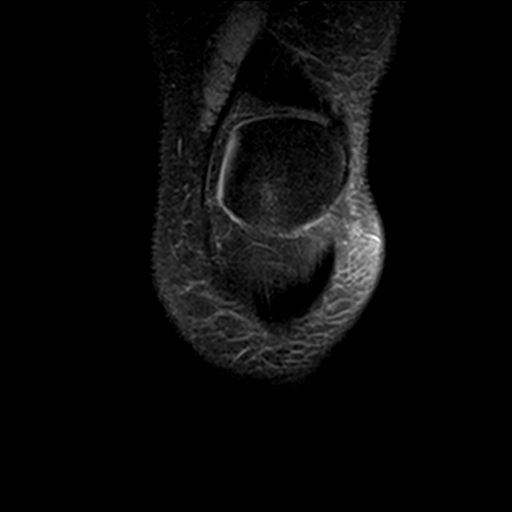
[im 9/27]
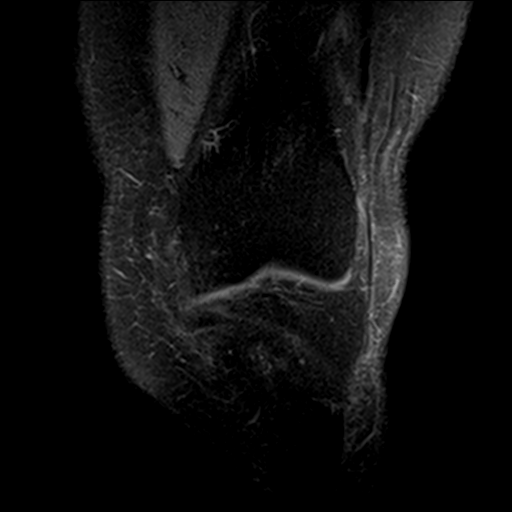
[im 14/27]
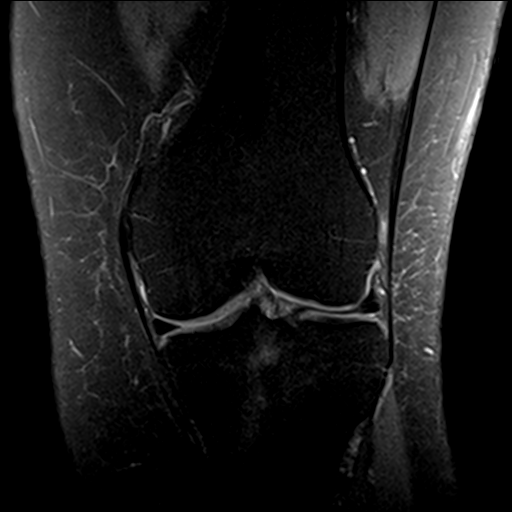
[im 18/27]
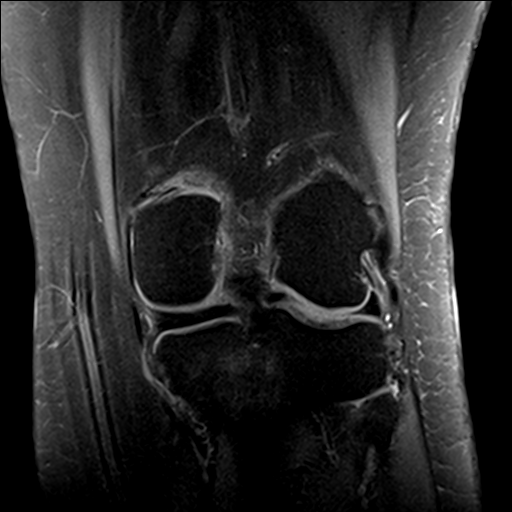
[im 22/27]
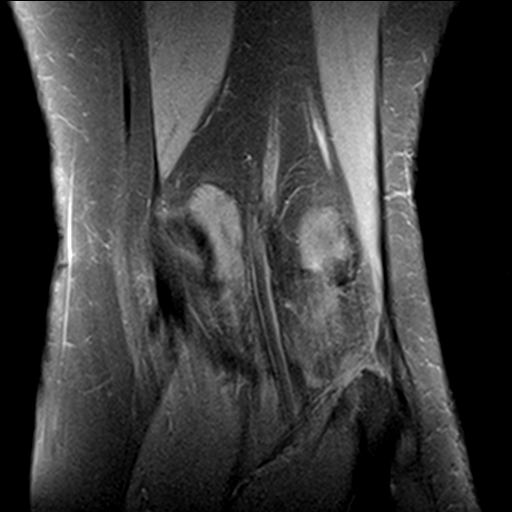
[im 27/27]
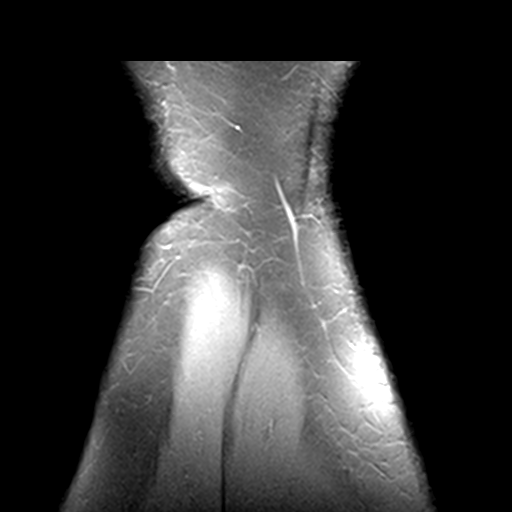

[Series 12: PD fat-sat · coronal · 2.3mm · 0.29mm/px · 4 of 14 slices shown (3 of 3)]
[im 1/14]
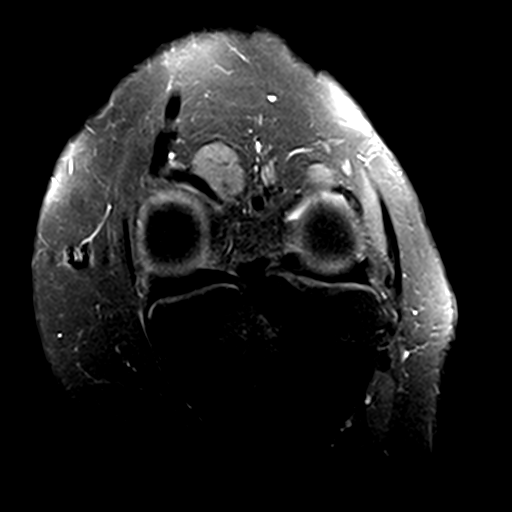
[im 5/14]
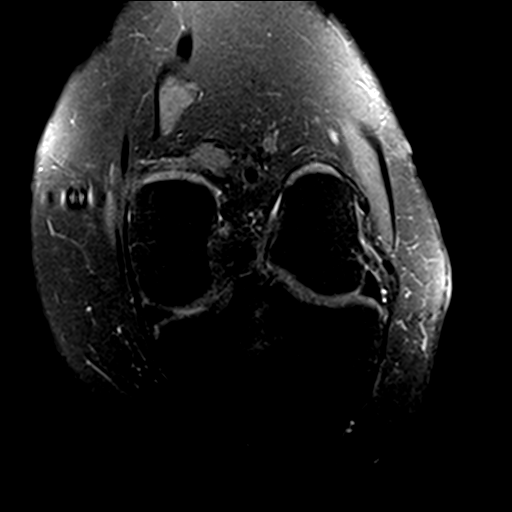
[im 9/14]
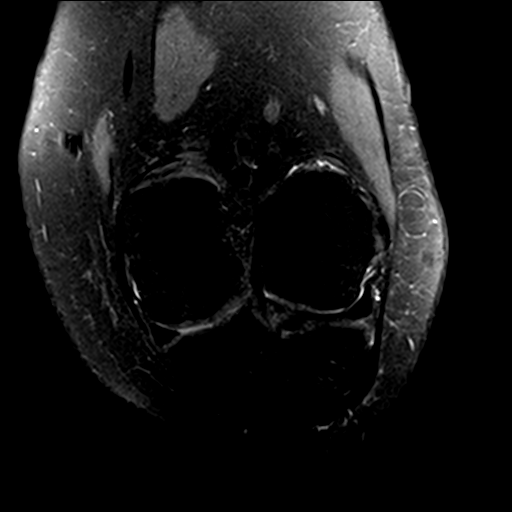
[im 14/14]
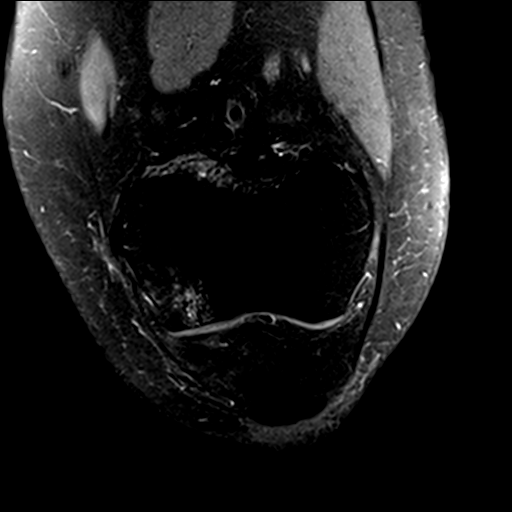

[21 of 40 positions shown; findings below may reference images not displayed]

FINDINGS: MENISCI

Medial: Intact.

Lateral: Small complex tear of the free edge of the body of the
lateral meniscus.

LIGAMENTS

Cruciates: ACL and PCL are intact.

Collaterals: Medial collateral ligament is intact. Lateral
collateral ligament complex is intact.

CARTILAGE

Patellofemoral: Cartilage fissuring of the patellar apex with
subchondral reactive marrow changes.

Medial: Partial-thickness cartilage loss of the anterior
weight-bearing surface of the medial femoral condyle with
subchondral reactive marrow edema.

Lateral:  No chondral defect.

JOINT: No joint effusion. Normal Veruna Rodina. No plical
thickening.

POPLITEAL FOSSA: Popliteus tendon is intact. No Baker's cyst.

EXTENSOR MECHANISM: Intact quadriceps tendon. Intact patellar
tendon. Intact lateral patellar retinaculum. Intact medial patellar
retinaculum. Intact MPFL.

BONES: No aggressive osseous lesion. No fracture or dislocation.
Subcortical reactive marrow edema at the base of the tibial
eminence.

Other: No fluid collection or hematoma. Muscles are normal.
Partial-thickness tear of the origin of the medial gastrocnemius
tendon.
IMPRESSION: 1. Small complex tear of the free edge of the body of the lateral
meniscus.
2. Cartilage fissuring of the patellar apex with subchondral
reactive marrow changes.
3. Partial-thickness cartilage loss of the anterior weight-bearing
surface of the medial femoral condyle with subchondral reactive
marrow edema.
